# Patient Record
Sex: Male | Born: 1945 | Race: White | Hispanic: No | Smoking: Former smoker
Health system: Southern US, Community
[De-identification: ages and names within clinical notes are randomized; demographics above are authoritative.]

## PROBLEM LIST (undated history)

## (undated) DIAGNOSIS — I639 Cerebral infarction, unspecified: Secondary | ICD-10-CM

## (undated) DIAGNOSIS — N281 Cyst of kidney, acquired: Secondary | ICD-10-CM

## (undated) DIAGNOSIS — E049 Nontoxic goiter, unspecified: Secondary | ICD-10-CM

## (undated) DIAGNOSIS — N200 Calculus of kidney: Secondary | ICD-10-CM

## (undated) DIAGNOSIS — N400 Enlarged prostate without lower urinary tract symptoms: Secondary | ICD-10-CM

## (undated) DIAGNOSIS — N3289 Other specified disorders of bladder: Secondary | ICD-10-CM

## (undated) DIAGNOSIS — K5190 Ulcerative colitis, unspecified, without complications: Secondary | ICD-10-CM

## (undated) DIAGNOSIS — K632 Fistula of intestine: Secondary | ICD-10-CM

## (undated) DIAGNOSIS — K2270 Barrett's esophagus without dysplasia: Secondary | ICD-10-CM

## (undated) DIAGNOSIS — J45909 Unspecified asthma, uncomplicated: Secondary | ICD-10-CM

## (undated) HISTORY — PX: OTHER SURGICAL HISTORY: SHX169

## (undated) HISTORY — DX: Cyst of kidney, acquired: N28.1

## (undated) HISTORY — DX: Ulcerative colitis, unspecified, without complications: K51.90

## (undated) HISTORY — DX: Gilbert syndrome: E80.4

## (undated) HISTORY — DX: Barrett's esophagus without dysplasia: K22.70

## (undated) HISTORY — DX: Unspecified asthma, uncomplicated: J45.909

## (undated) HISTORY — DX: Cerebral infarction, unspecified: I63.9

## (undated) HISTORY — DX: Nontoxic goiter, unspecified: E04.9

## (undated) HISTORY — DX: Benign prostatic hyperplasia without lower urinary tract symptoms: N40.0

## (undated) HISTORY — DX: Calculus of kidney: N20.0

## (undated) HISTORY — DX: Other specified disorders of bladder: N32.89

## (undated) HISTORY — PX: CHOLECYSTECTOMY: SHX55

## (undated) HISTORY — DX: Fistula of intestine: K63.2

---

## 1990-08-16 HISTORY — PX: HERNIA REPAIR: SHX51

## 2004-08-16 DIAGNOSIS — I639 Cerebral infarction, unspecified: Secondary | ICD-10-CM

## 2004-08-16 HISTORY — DX: Cerebral infarction, unspecified: I63.9

## 2010-08-16 HISTORY — PX: PROSTATE BIOPSY: SHX241

## 2013-03-16 HISTORY — PX: HERNIA REPAIR: SHX51

## 2015-08-17 DIAGNOSIS — K5190 Ulcerative colitis, unspecified, without complications: Secondary | ICD-10-CM

## 2015-08-17 HISTORY — DX: Ulcerative colitis, unspecified, without complications: K51.90

## 2015-11-10 DIAGNOSIS — K639 Disease of intestine, unspecified: Secondary | ICD-10-CM | POA: Insufficient documentation

## 2017-04-04 DIAGNOSIS — K632 Fistula of intestine: Secondary | ICD-10-CM

## 2017-04-04 DIAGNOSIS — IMO0002 Reserved for concepts with insufficient information to code with codable children: Secondary | ICD-10-CM

## 2017-04-04 HISTORY — DX: Reserved for concepts with insufficient information to code with codable children: IMO0002

## 2017-04-04 HISTORY — DX: Fistula of intestine: K63.2

## 2017-08-30 DIAGNOSIS — T365X5A Adverse effect of aminoglycosides, initial encounter: Secondary | ICD-10-CM | POA: Insufficient documentation

## 2017-08-30 DIAGNOSIS — H818X3 Other disorders of vestibular function, bilateral: Secondary | ICD-10-CM | POA: Insufficient documentation

## 2017-12-22 ENCOUNTER — Encounter: Payer: Self-pay | Admitting: Internal Medicine

## 2018-01-02 ENCOUNTER — Encounter: Payer: Self-pay | Admitting: Internal Medicine

## 2018-01-30 ENCOUNTER — Encounter: Payer: Self-pay | Admitting: Internal Medicine

## 2018-02-13 ENCOUNTER — Ambulatory Visit (INDEPENDENT_AMBULATORY_CARE_PROVIDER_SITE_OTHER): Payer: Medicare Other | Admitting: Internal Medicine

## 2018-02-13 ENCOUNTER — Encounter: Payer: Self-pay | Admitting: Internal Medicine

## 2018-02-13 ENCOUNTER — Other Ambulatory Visit (INDEPENDENT_AMBULATORY_CARE_PROVIDER_SITE_OTHER): Payer: Medicare Other

## 2018-02-13 VITALS — BP 140/80 | HR 62 | Ht 71.0 in | Wt 161.2 lb

## 2018-02-13 DIAGNOSIS — J45991 Cough variant asthma: Secondary | ICD-10-CM

## 2018-02-13 DIAGNOSIS — R05 Cough: Secondary | ICD-10-CM | POA: Diagnosis not present

## 2018-02-13 DIAGNOSIS — R059 Cough, unspecified: Secondary | ICD-10-CM

## 2018-02-13 LAB — CBC WITH DIFFERENTIAL/PLATELET
Basophils Absolute: 0 10*3/uL (ref 0.0–0.1)
Basophils Relative: 0.8 % (ref 0.0–3.0)
EOS PCT: 7.1 % — AB (ref 0.0–5.0)
Eosinophils Absolute: 0.4 10*3/uL (ref 0.0–0.7)
HCT: 45.4 % (ref 39.0–52.0)
HEMOGLOBIN: 15.2 g/dL (ref 13.0–17.0)
LYMPHS PCT: 18.4 % (ref 12.0–46.0)
Lymphs Abs: 1 10*3/uL (ref 0.7–4.0)
MCHC: 33.6 g/dL (ref 30.0–36.0)
MCV: 88.3 fl (ref 78.0–100.0)
MONO ABS: 0.6 10*3/uL (ref 0.1–1.0)
MONOS PCT: 11 % (ref 3.0–12.0)
Neutro Abs: 3.5 10*3/uL (ref 1.4–7.7)
Neutrophils Relative %: 62.7 % (ref 43.0–77.0)
Platelets: 172 10*3/uL (ref 150.0–400.0)
RBC: 5.14 Mil/uL (ref 4.22–5.81)
RDW: 13.8 % (ref 11.5–15.5)
WBC: 5.6 10*3/uL (ref 4.0–10.5)

## 2018-02-13 LAB — NITRIC OXIDE: NITRIC OXIDE: 294

## 2018-02-13 MED ORDER — BUDESONIDE-FORMOTEROL FUMARATE 80-4.5 MCG/ACT IN AERO: 2.0000 | INHALATION_SPRAY | Freq: Two times a day (BID) | RESPIRATORY_TRACT | 11 refills | Status: DC

## 2018-02-13 MED ORDER — BUDESONIDE-FORMOTEROL FUMARATE 80-4.5 MCG/ACT IN AERO: 2.0000 | INHALATION_SPRAY | Freq: Two times a day (BID) | RESPIRATORY_TRACT | 0 refills | Status: DC

## 2018-02-13 NOTE — Assessment & Plan Note (Addendum)
FENO 02/13/2018  =   294 on no rx  - Spirometry 02/13/2018  FEV1 3.29 (99%)  Ratio 80 on no rx  - Allergy profile 02/13/2018 >  Eos 0. /  IgE pending   - 02/13/2018  After extensive coaching inhaler device  effectiveness =    90% > try symbicort 80 2bid  - Sinus CT to be done on return    DDX of  difficult airways management almost all start with A and  include Adherence, Ace Inhibitors, Acid Reflux, Active Sinus Disease, Alpha 1 Antitripsin deficiency, Anxiety masquerading as Airways dz,  ABPA,  Allergy(esp in young), Aspiration (esp in elderly), Adverse effects of meds,  Active smokers, A bunch of PE's (a small clot burden can't cause this syndrome unless there is already severe underlying pulm or vascular dz with poor reserve) plus two Bs  = Bronchiectasis and Beta blocker use..and one C= CHF     Adherence is always the initial "prime suspect" and is a multilayered concern that requires a "trust but verify" approach in every patient - starting with knowing how to use medications, especially inhalers, correctly, keeping up with refills and understanding the fundamental difference between maintenance and prns vs those medications only taken for a very short course and then stopped and not refilled.  - see hfa teaching  - return with all meds in hand using a trust but verify approach to confirm accurate Medication  Reconciliation The principal here is that until we are certain that the  patients are doing what we've asked, it makes no sense to ask them to do more.    Allergy ? Source > try low dose ics = symb 80 2bid / check allergy profile, low treshold to add singulair in the setting of such a high feno  ? Active sinus dz s/p remote sinus surgery > sinus CT on return   ? Alpha one AT def > very unlikely with nl pfts = this is not copd    Will return in 4 weeks with sinus ct and go from there.   Total time devoted to counseling  > 50 % of initial 60 min office visit:  review case with pt/ discussion  of options/alternatives/ personally creating written customized instructions  in presence of pt  then going over those specific  Instructions directly with the pt including how to use all of the meds but in particular covering each new medication in detail and the difference between the maintenance= "automatic" meds and the prns using an action plan format for the latter (If this problem/symptom => do that organization reading Left to right).  Please see AVS from this visit for a full list of these instructions which I personally wrote for this pt and  are unique to this visit.   See device teaching which extended face to face time for this visit

## 2018-02-13 NOTE — Progress Notes (Signed)
Subjective:    Patient ID: Juan House, male    DOB: 24-Nov-1945, 72 y.o.   MRN: [id number]  HPI   57 yowm quit smoking 2006 and grew up in house full of smokers with tendency  Ear  infections as child never required surgery but as an adult started having problems with nasal congestion/ recurrent sinus infections in his 14s and required sinus surgery around 2013 and some better but then fairly soon after sinus surgery noted  more freq lower respiratory symptoms > recurrent cough/ pna 2016 > O'neal eval with CT 09/26/14 showed only emphysema rec prn proair but found pt didn't really need it and did fine until Feb 2019 when started out with cold  Then sob /cough better with proair and rx with prednisone /abx back to nl then same symptoms in May 2019 then again [facility] June 2019 abx/ prednisone all better but referred 02/13/2018 by Dr Thea Silversmith for ? Why recurrent bronchitis.    02/13/2018 1st [facility] office visit/   s present complaints  Chief Complaint  Patient presents with  . Pulmonary Consult    referred here by Dr. Thea Silversmith, recurrent bronchitis  last proair 2 weeks prior to OV   Last pred x June 22   and last doxy was February 06 2018 and feels fine right now Has 2 story house, walks a mile and a half every day, some doe x hills sats never lower than 88%    No obvious patterns in day to day or daytime variability or assoc excess/ purulent sputum or mucus plugs or hemoptysis or cp or chest tightness, subjective wheeze or overt sinus or hb symptoms.   Sleeps fine flat on 1 pillow without nocturnal  or early am exacerbation  of respiratory  c/o's or need for noct saba. Also denies any obvious fluctuation of symptoms with weather or environmental changes or other aggravating or alleviating factors except as outlined above   No unusual exposure hx or h/o childhood pna/ asthma or knowledge of premature birth.  Current Allergies, Complete Past Medical History, Past Surgical  History, Family History, and Social History were reviewed in [employer] record.     Current Meds  Medication Sig  . folic acid (FOLVITE) 1 MG tablet TK 1 T PO BID  . mesalamine (LIALDA) 1.2 g EC tablet   . PROAIR HFA 108 (90 Base) MCG/ACT inhaler INL 2 PFS PO QID PRN  . tamsulosin (FLOMAX) 0.4 MG CAPS capsule TK 1 C PO D        Review of Systems  Constitutional: Negative for fever and unexpected weight change.  HENT: Positive for dental problem and sneezing. Negative for congestion, ear pain, nosebleeds, postnasal drip, rhinorrhea, sinus pressure, sore throat and trouble swallowing.   Eyes: Negative for redness and itching.  Respiratory: Positive for cough and shortness of breath. Negative for chest tightness and wheezing.   Cardiovascular: Negative for palpitations and leg swelling.  Gastrointestinal: Negative for nausea and vomiting.  Genitourinary: Negative for dysuria.  Musculoskeletal: Negative for joint swelling.  Skin: Negative for rash.  Neurological: Negative for headaches.  Hematological: Does not bruise/bleed easily.  Psychiatric/Behavioral: Negative for dysphoric mood. The patient is not nervous/anxious.        Objective:   Physical Exam  amb pleasant wm nad   Wt Readings from Last 3 Encounters:  02/13/18 161 lb 3.2 oz (73.1 kg)     Vital signs reviewed - Note on arrival 02 sats  100% on RA     HEENT: nl  turbinates bilaterally, and oropharynx. Nl external ear canals without cough reflex - mild turbinate edema non-specific pattern / partial lower dental plates   NECK :  without JVD/Nodes/TM/ nl carotid upstrokes bilaterally   LUNGS: no acc muscle use,  Nl contour chest which is clear to A and P bilaterally without cough on insp or exp maneuvers   CV:  RRR  no s3 or murmur or increase in P2, and no edema   ABD:  soft and nontender with nl inspiratory excursion in the supine position. No bruits or organomegaly appreciated, bowel  sounds nl  MS:  Nl gait/ ext warm without deformities, calf tenderness, cyanosis or clubbing No obvious joint restrictions   SKIN: warm and dry without lesions    NEURO:  alert, approp, nl sensorium with  no motor or cerebellar deficits apparent.      I personally reviewed images and agree with radiology impression as follows:  CXR:   12/22/17 and 01/30/18 no acute cardiopulmonary process    Labs ordered 02/13/2018   Allergy profile      Assessment & Plan:

## 2018-02-13 NOTE — Patient Instructions (Addendum)
Plan A = Automatic = symbicort 80 Take 2 puffs first thing in am and then another 2 puffs about 12 hours later.   Work on inhaler technique:  relax and gently blow all the way out then take a nice smooth deep breath back in, triggering the inhaler at same time you start breathing in.  Hold for up to 5 seconds if you can. Blow out thru nose. Rinse and gargle with water when done      Plan B = Backup Only use your albuterol as a rescue medication to be used if you can't catch your breath by resting or doing a relaxed purse lip breathing pattern.  - The less you use it, the better it will work when you need it. - Ok to use the inhaler up to 2 puffs  every 4 hours if you must but call for appointment if use goes up over your usual need - Don't leave home without it !!  (think of it like the spare tire for your car)     Please see patient coordinator before you leave today  to schedule sinus CT before next ov   Please remember to go to the lab department downstairs in the basement  for your tests - we will call you with the results when they are available.   Please schedule a follow up office visit in 4 weeks, sooner if needed

## 2018-02-14 LAB — INTERPRETATION:

## 2018-02-14 LAB — RESPIRATORY ALLERGY PROFILE REGION II ~~LOC~~
ALLERGEN, A. ALTERNATA, M6: 0.11 kU/L — AB
ASPERGILLUS FUMIGATUS M3: 0.17 kU/L — AB
Allergen, Cedar tree, t12: 0.16 kU/L — ABNORMAL HIGH
Allergen, Cottonwood, t14: 0.15 kU/L — ABNORMAL HIGH
Allergen, D pternoyssinus,d7: 2.54 kU/L — ABNORMAL HIGH
Allergen, Mouse Urine Protein, e78: <0.1 kU/L
Allergen, Mulberry, t76: <0.1 kU/L
Allergen, Oak,t7: 0.1 kU/L — ABNORMAL HIGH
Allergen, P. notatum, m1: 0.14 kU/L — ABNORMAL HIGH
BERMUDA GRASS: 0.12 kU/L — AB
BOX ELDER: 0.12 kU/L — AB
CLADOSPORIUM HERBARUM (M2) IGE: 0.3 kU/L — ABNORMAL HIGH
CLASS: 0
CLASS: 0
CLASS: 0
CLASS: 0
CLASS: 0
CLASS: 0
CLASS: 0
CLASS: 2
CLASS: 2
COMMON RAGWEED (SHORT) (W1) IGE: <0.1 kU/L
Cat Dander: <0.1 kU/L
Class: 0
Class: 0
Class: 0
Class: 0
Class: 0
Class: 0
Class: 0
Class: 0
Class: 0
Class: 0
Class: 0
Class: 0
Class: 0
Class: 0
Class: 2
Cockroach: 0.9 kU/L — ABNORMAL HIGH
D. FARINAE: 2.18 kU/L — AB
Dog Dander: 0.1 kU/L — ABNORMAL HIGH
Elm IgE: <0.1 kU/L
IGE (IMMUNOGLOBULIN E), SERUM: 1746 kU/L — AB (ref <=114)
JOHNSON GRASS: 0.12 kU/L — AB
ROUGH PIGWEED IGE: 0.12 kU/L — AB
SHEEP SORREL IGE: 0.14 kU/L — AB
Timothy Grass: 0.11 kU/L — ABNORMAL HIGH

## 2018-02-15 ENCOUNTER — Telehealth: Payer: Self-pay | Admitting: Internal Medicine

## 2018-02-15 NOTE — Telephone Encounter (Signed)
Notes recorded by Tanda Rockers, MD on 02/15/2018 at 5:56 AM EDT Call patient : Studies are c/w very severe allergies to grass, trees, dust > avoidance for now, Be sure patient has/keeps f/u ov so we can go over all the details of this study and get a plan together moving forward - ok to move up f/u if not feeling better and wants to be seen sooner   lmtcb x1 for pt

## 2018-02-15 NOTE — Telephone Encounter (Signed)
Advised pt of results. Pt understood and nothing further is needed.   

## 2018-02-15 NOTE — Telephone Encounter (Signed)
Patient returned phone call; pt contact # [phone]

## 2018-03-20 ENCOUNTER — Encounter: Payer: Self-pay | Admitting: Internal Medicine

## 2018-03-20 ENCOUNTER — Ambulatory Visit (INDEPENDENT_AMBULATORY_CARE_PROVIDER_SITE_OTHER): Payer: Medicare Other | Admitting: Internal Medicine

## 2018-03-20 ENCOUNTER — Ambulatory Visit (INDEPENDENT_AMBULATORY_CARE_PROVIDER_SITE_OTHER)
Admission: RE | Admit: 2018-03-20 | Discharge: 2018-03-20 | Disposition: A | Discharge status: Home / Self Care | Payer: Medicare Other | Source: Ambulatory Visit | Attending: Internal Medicine | Admitting: Internal Medicine

## 2018-03-20 VITALS — BP 124/60 | HR 67 | Ht 71.0 in | Wt 160.0 lb

## 2018-03-20 DIAGNOSIS — R05 Cough: Secondary | ICD-10-CM

## 2018-03-20 DIAGNOSIS — J45991 Cough variant asthma: Secondary | ICD-10-CM

## 2018-03-20 DIAGNOSIS — R059 Cough, unspecified: Secondary | ICD-10-CM

## 2018-03-20 LAB — NITRIC OXIDE: Nitric Oxide: 98

## 2018-03-20 MED ORDER — BUDESONIDE-FORMOTEROL FUMARATE 160-4.5 MCG/ACT IN AERO: 2.0000 | INHALATION_SPRAY | Freq: Two times a day (BID) | RESPIRATORY_TRACT | 0 refills | Status: DC

## 2018-03-20 NOTE — Progress Notes (Signed)
Subjective:    Patient ID: Juan House, male    DOB: 09-Jun-1946, 72 y.o.   MRN: [id number]    Brief patient profile:  26 yowm quit smoking 2006 and grew up in house full of smokers with tendency  Ear  infections as child never required surgery but as an adult started having problems with nasal congestion/ recurrent sinus infections in his 71s and required sinus surgery around 2013 and some better but then fairly soon after sinus surgery noted  more freq lower respiratory symptoms > recurrent cough/ pna 2016 > O'neal eval with CT 09/26/14 showed only emphysema rec prn proair but found pt didn't really need it and did fine until Feb 2019 when started out with cold  Then sob /cough better with proair and rx with prednisone /abx back to nl then same symptoms in May 2019 then again [facility] June 2019 abx/ prednisone all better but referred 02/13/2018 by Dr Thea Silversmith for ? Why recurrent bronchitis.   History of Present Illness  02/13/2018 1st [facility] office visit/   s present complaints  Chief Complaint  Patient presents with  . Pulmonary Consult    referred here by Dr. Thea Silversmith, recurrent bronchitis  last proair 2 weeks prior to OV   Last pred x June 22   and last doxy was February 06 2018 and feels fine right now Has 2 story house, walks a mile and a half every day, some doe x hills sats never lower than 88% rec Plan A = Automatic = symbicort 80 Take 2 puffs first thing in am and then another 2 puffs about 12 hours later.  Work on inhaler technique:  Plan B = Backup Only use your albuterol as a rescue medication   03/20/2018  f/u ov/ re: asthma cough variant  Chief Complaint  Patient presents with  . Follow-up    Breathing has improved some. He notices he is only SOB when he first wakes. He has not had to use his proair.   Dyspnea:  Not limited by breathing from desired activities - feels fine p am symbicort but delays taking the first 2 puffs instead of using first  thing in am   Cough: none  SABA use: not since started on symb    No obvious day to day or daytime variability or assoc excess/ purulent sputum or mucus plugs or hemoptysis or cp or chest tightness, subjective wheeze or overt sinus or hb symptoms.   Sleeping: flat one pillow without nocturnal   exacerbation  of respiratory  c/o's or need for noct saba. Also denies any obvious fluctuation of symptoms with weather or environmental changes or other aggravating or alleviating factors except as outlined above   No unusual exposure hx or h/o childhood pna/ asthma or knowledge of premature birth.  Current Allergies, Complete Past Medical History, Past Surgical History, Family History, and Social History were reviewed in [employer] record.  ROS  The following are not active complaints unless bolded Hoarseness, sore throat, dysphagia, dental problems, itching, sneezing,  nasal congestion or discharge of excess mucus or purulent secretions, ear ache,   fever, chills, sweats, unintended wt loss or wt gain, classically pleuritic or exertional cp,  orthopnea pnd or arm/hand swelling  or leg swelling, presyncope, palpitations, abdominal pain, anorexia, nausea, vomiting, diarrhea  or change in bowel habits or change in bladder habits, change in stools or change in urine, dysuria, hematuria,  rash, arthralgias, visual complaints, headache, numbness, weakness  or ataxia or problems with walking or coordination,  change in mood or  memory.        Current Meds  Medication Sig  . folic acid (FOLVITE) 1 MG tablet TK 1 T PO BID  . mesalamine (LIALDA) 1.2 g EC tablet   . omeprazole (PRILOSEC) 40 MG capsule Take 1 capsule by mouth daily before breakfast.  . PROAIR HFA 108 (90 Base) MCG/ACT inhaler INL 2 PFS PO QID PRN  . tamsulosin (FLOMAX) 0.4 MG CAPS capsule TK 1 C PO D  .   budesonide-formoterol (SYMBICORT) 80-4.5 MCG/ACT inhaler Inhale 2 puffs into the lungs 2 (two) times daily.          .          Objective:   Physical Exam  amb pleasant wm nad   Wt Readings from Last 3 Encounters:  02/13/18 161 lb 3.2 oz (73.1 kg)     Vital signs reviewed - Note on arrival 02 sats  100% on RA     HEENT: nl dentition, turbinates bilaterally, and oropharynx. Nl external ear canals without cough reflex   NECK :  without JVD/Nodes/TM/ nl carotid upstrokes bilaterally   LUNGS: no acc muscle use,  Nl contour chest which is clear to A and P bilaterally without cough on insp or exp maneuvers   CV:  RRR  no s3 or murmur or increase in P2, and no edema   ABD:  soft and nontender with nl inspiratory excursion in the supine position. No bruits or organomegaly appreciated, bowel sounds nl  MS:  Nl gait/ ext warm without deformities, calf tenderness, cyanosis or clubbing No obvious joint restrictions   SKIN: warm and dry without lesions    NEURO:  alert, approp, nl sensorium with  no motor or cerebellar deficits apparent.                 Assessment & Plan:

## 2018-03-20 NOTE — Patient Instructions (Addendum)
Try symbicort 160 Take 2 puffs first thing in am and then another 2 puffs about 12 hours later.   singulair 10 mg each pm can added for either worse upper or lower respiratory symptoms    If more hoarse on 160 go back to the 80 strength    Please schedule a follow up visit in 3 months but call sooner if needed

## 2018-03-22 ENCOUNTER — Encounter: Payer: Self-pay | Admitting: Internal Medicine

## 2018-03-22 NOTE — Assessment & Plan Note (Signed)
FENO 02/13/2018  =   294 on no rx  - Spirometry 02/13/2018  FEV1 3.29 (99%)  Ratio 80 on no rx  - Allergy profile 02/13/2018 >  Eos 0.4 /  IgE  1745  Grass, trees, dust  - 02/13/2018  After extensive coaching inhaler device  effectiveness =    90% > try symbicort 80 2bid  - Sinus CT 03/20/2018 >>>  1. Diffuse opacification of anterior and posterior ethmoid air cells and bilateral frontal sinuses. 2. Moderate mucosal thickening involving the dependent maxillary and ethmoid air cells bilaterally - FENO 03/20/2018  =   98   - The proper method of use, as well as anticipated side effects, of a metered-dose inhaler are discussed and demonstrated to the patient.     Despite severe allergies as above and persistent elevation of IgE All goals of chronic asthma control met including optimal function and elimination of symptoms with minimal need for rescue therapy.  Contingencies discussed in full including contacting this office immediately if not controlling the symptoms using the rule of two's.      Will increase symb to 160 2bid based on feno and then add singulair if not 100% and if still not adequately controlled will be candidate for biologics for sure   I had an extended discussion with the patient reviewing all relevant studies completed to date and  lasting 15 to 20 minutes of a 25 minute visit    See device teaching which extended face to face time for this visit.  Each maintenance medication was reviewed in detail including emphasizing most importantly the difference between maintenance and prns and under what circumstances the prns are to be triggered using an action plan format that is not reflected in the computer generated alphabetically organized AVS which I have not found useful in most complex patients, especially with respiratory illnesses  Please see AVS for specific instructions unique to this visit that I personally wrote and verbalized to the the pt in detail and then reviewed with pt  by  my nurse highlighting any  changes in therapy recommended at today's visit to their plan of care.

## 2018-04-18 ENCOUNTER — Other Ambulatory Visit: Payer: Self-pay | Admitting: Internal Medicine

## 2018-04-18 MED ORDER — MONTELUKAST SODIUM 10 MG PO TABS: 10.0000 mg | ORAL_TABLET | Freq: Every day | ORAL | 5 refills | Status: DC

## 2018-04-18 NOTE — Telephone Encounter (Signed)
Replied to patient's email offering the order for Singulair.

## 2018-04-18 NOTE — Telephone Encounter (Signed)
Worth trying singulair 10 mg daily and f/u with ent prn

## 2018-04-18 NOTE — Telephone Encounter (Signed)
Dr. Melvyn Novas please advise on patients e-mail below. Thank you.

## 2018-06-20 ENCOUNTER — Encounter: Payer: Self-pay | Admitting: Internal Medicine

## 2018-06-20 ENCOUNTER — Ambulatory Visit (INDEPENDENT_AMBULATORY_CARE_PROVIDER_SITE_OTHER): Payer: Medicare Other | Admitting: Internal Medicine

## 2018-06-20 VITALS — BP 126/74 | HR 60 | Ht 71.0 in | Wt 160.0 lb

## 2018-06-20 DIAGNOSIS — J45991 Cough variant asthma: Secondary | ICD-10-CM | POA: Diagnosis not present

## 2018-06-20 MED ORDER — BUDESONIDE-FORMOTEROL FUMARATE 80-4.5 MCG/ACT IN AERO: INHALATION_SPRAY | RESPIRATORY_TRACT | Status: DC

## 2018-06-20 NOTE — Patient Instructions (Addendum)
No change in medications    Please schedule a follow up visit in 6  months but call sooner if needed  

## 2018-06-20 NOTE — Progress Notes (Addendum)
Subjective:    Patient ID: Juan House, male    DOB: 22-Jan-1946, 72 y.o.   MRN: [id number]    Brief patient profile:  72 yowm quit smoking 2006 and grew up in house full of smokers with tendency  Ear  infections as child never required surgery but as an adult started having problems with nasal congestion/ recurrent sinus infections in his 43s and required sinus surgery around 2013 and some better but then fairly soon after sinus surgery noted  more freq lower respiratory symptoms > recurrent cough/ pna 2016 > O'neal eval with CT 09/26/14 showed only emphysema rec prn proair but found pt didn't really need it and did fine until Feb 2019 when started out with cold  Then sob /cough better with proair and rx with prednisone /abx back to nl then same symptoms in May 2019 then again [facility] June 2019 abx/ prednisone all better but referred 02/13/2018 by Dr Thea Silversmith for ? Why recurrent bronchitis.   History of Present Illness  02/13/2018 1st [facility] office visit/   s present complaints  Chief Complaint  Patient presents with  . Pulmonary Consult    referred here by Dr. Thea Silversmith, recurrent bronchitis  last proair 2 weeks prior to OV   Last pred x June 22   and last doxy was February 06 2018 and feels fine right now Has 2 story house, walks a mile and a half every day, some doe x hills sats never lower than 88% rec Plan A = Automatic = symbicort 80 Take 2 puffs first thing in am and then another 2 puffs about 12 hours later.  Work on inhaler technique:  Plan B = Backup Only use your albuterol as a rescue medication   03/20/2018  f/u ov/ re: asthma cough variant  Chief Complaint  Patient presents with  . Follow-up    Breathing has improved some. He notices he is only SOB when he first wakes. He has not had to use his proair.   Dyspnea:  Not limited by breathing from desired activities - feels fine p am symbicort but delays taking the first 2 puffs instead of using first  thing in am   Cough: none SABA use: not since started on symb  rec Try symbicort 160 Take 2 puffs first thing in am and then another 2 puffs about 12 hours later.  singulair 10 mg each pm can added for either worse upper or lower respiratory symptoms  If more hoarse on 160 go back to the 80 strength      06/20/2018  f/u ov/ re: on symb 80 2bid and singulair maint / worse hoarseness with sym 160 Chief Complaint  Patient presents with  . Follow-up    Breathing has improved to his normal baseline. He has not needed his albuterol inhaler.   Not limited by breathing from desired activities   No cough/ sleeping fine / no need for saba   No obvious day to day or daytime variability or assoc excess/ purulent sputum or mucus plugs or hemoptysis or cp or chest tightness, subjective wheeze or overt sinus or hb symptoms.   sleepoing without nocturnal  or early am exacerbation  of respiratory  c/o's or need for noct saba. Also denies any obvious fluctuation of symptoms with weather or environmental changes or other aggravating or alleviating factors except as outlined above   No unusual exposure hx or h/o childhood pna/ asthma or knowledge of premature birth.  Current  Allergies, Complete Past Medical History, Past Surgical History, Family History, and Social History were reviewed in [employer] record.  ROS  The following are not active complaints unless bolded Hoarseness, sore throat, dysphagia, dental problems, itching, sneezing,  nasal congestion or discharge of excess mucus or purulent secretions, ear ache,   fever, chills, sweats, unintended wt loss or wt gain, classically pleuritic or exertional cp,  orthopnea pnd or arm/hand swelling  or leg swelling, presyncope, palpitations, abdominal pain, anorexia, nausea, vomiting, diarrhea  or change in bowel habits or change in bladder habits, change in stools or change in urine, dysuria, hematuria,  rash, arthralgias, visual  complaints, headache, numbness, weakness or ataxia or problems with walking or coordination,  change in mood or  Memory. Other:  Dizziness chronically         Current Meds  Medication Sig  . folic acid (FOLVITE) 1 MG tablet TK 1 T PO BID  . mesalamine (LIALDA) 1.2 g EC tablet   . montelukast (SINGULAIR) 10 MG tablet TAKE 1 TABLET(10 MG) BY MOUTH AT BEDTIME  . PROAIR HFA 108 (90 Base) MCG/ACT inhaler INL 2 PFS PO QID PRN  . tamsulosin (FLOMAX) 0.4 MG CAPS capsule TK 1 C PO D  .   budesonide-formoterol (SYMBICORT) 80-4.5 MCG/ACT inhaler Inhale 2 puffs into the lungs 2 (two) times daily.          .          Objective:   Physical Exam  amb wm nad    Wt Readings from Last 3 Encounters:  06/20/18 160 lb (72.6 kg)  03/20/18 160 lb (72.6 kg)  02/13/18 161 lb 3.2 oz (73.1 kg)     Vital signs reviewed - Note on arrival 02 sats  99% on RA      HEENT: nl dentition,  , and oropharynx. Nl external ear canals with excessive wax on L / mod severe  bilateral non-specific turbinate edema    NECK :  without JVD/Nodes/TM/ nl carotid upstrokes bilaterally   LUNGS: no acc muscle use,  Nl contour chest which is clear to A and P bilaterally without cough on insp or exp maneuvers   CV:  RRR  no s3 or murmur or increase in P2, and no edema   ABD:  soft and nontender with nl inspiratory excursion in the supine position. No bruits or organomegaly appreciated, bowel sounds nl  MS:  Nl gait/ ext warm without deformities, calf tenderness, cyanosis or clubbing No obvious joint restrictions   SKIN: warm and dry without lesions    NEURO:  alert, approp, nl sensorium with  no motor or cerebellar deficits apparent.                Assessment & Plan:

## 2018-06-21 ENCOUNTER — Encounter: Payer: Self-pay | Admitting: Internal Medicine

## 2018-06-21 NOTE — Assessment & Plan Note (Addendum)
FENO 02/13/2018  =   294 on no rx  - Spirometry 02/13/2018  FEV1 3.29 (99%)  Ratio 80 on no rx  - Allergy profile 02/13/2018 >  Eos 0.4 /  IgE  1745  Grass, trees, dust  - 02/13/2018  After extensive coaching inhaler device  effectiveness =    90% > try symbicort 80 2bid  - Sinus CT 03/20/2018 >>>  1. Diffuse opacification of anterior and posterior ethmoid air cells and bilateral frontal sinuses. 2. Moderate mucosal thickening involving the dependent maxillary and ethmoid air cells bilaterally - FENO 03/20/2018  =   98  - 06/20/2018  After extensive coaching inhaler device,  effectiveness =  75%  On just the symb 80 2bid and singulair >>> All goals of chronic asthma control met including optimal function and elimination of symptoms with minimal need for rescue therapy.  Contingencies discussed in full including contacting this office immediately if not controlling the symptoms using the rule of two's.     I had an extended discussion with the patient reviewing all relevant studies completed to date and  lasting 10 minutes of a 15 minute visit    See device teaching which extended face to face time for this visit.  Each maintenance medication was reviewed in detail including emphasizing most importantly the difference between maintenance and prns and under what circumstances the prns are to be triggered using an action plan format that is not reflected in the computer generated alphabetically organized AVS which I have not found useful in most complex patients, especially with respiratory illnesses  Please see AVS for specific instructions unique to this visit that I personally wrote and verbalized to the the pt in detail and then reviewed with pt  by my nurse highlighting any  changes in therapy recommended at today's visit to their plan of care.

## 2018-06-29 ENCOUNTER — Telehealth: Payer: Self-pay | Admitting: Internal Medicine

## 2018-06-29 DIAGNOSIS — J328 Other chronic sinusitis: Secondary | ICD-10-CM

## 2018-06-29 NOTE — Telephone Encounter (Signed)
Called and spoke with patient, he stated that he would like to be referred to an ENT for his sinus issues and dizziness. Patient would like for this to be taken care of soon. Patient was sent for an MRI by his PCP and they stated that he had serious sinus issues.    MW please advise, thank you.

## 2018-06-29 NOTE — Telephone Encounter (Signed)
Patient returned call [phone].

## 2018-06-29 NOTE — Telephone Encounter (Signed)
LMTCB

## 2018-06-30 NOTE — Telephone Encounter (Signed)
Attempted to contact pt. I did not receive an answer. I have left a message for pt to return our call.  

## 2018-06-30 NOTE — Telephone Encounter (Signed)
There is a CT of sinuses in epic that I did that shows some chronic changes but nothing acute - I don't see an mri anywhere so suspect he's talking about at CT.  I'm fine with referring him to [facility] group with dx chronic sinusitis but not sure the dizziness is related - they do see that all the time so he can ask if they think that the sinus ct finding are related or whether it's an inner ear problem since they are sinus and ear doctors.

## 2018-06-30 NOTE — Telephone Encounter (Signed)
Called pt and advised message from the provider. Pt understood and verbalized understanding. Nothing further is needed.   Referral for ENT sent.

## 2018-06-30 NOTE — Telephone Encounter (Signed)
Mr. Melchior has been attempting to return call to Mendel Ryder and is unable to get thru. I told him I would send a message to call him back at [phone]. Donell Sievert [facility]

## 2018-06-30 NOTE — Telephone Encounter (Signed)
Patient is returning Juan House's call, CB is [phone]

## 2018-07-17 DIAGNOSIS — H8193 Unspecified disorder of vestibular function, bilateral: Secondary | ICD-10-CM | POA: Insufficient documentation

## 2018-07-17 DIAGNOSIS — J324 Chronic pansinusitis: Secondary | ICD-10-CM | POA: Insufficient documentation

## 2018-09-11 MED ORDER — MONTELUKAST SODIUM 10 MG PO TABS: ORAL_TABLET | ORAL | 3 refills | Status: DC

## 2018-12-19 ENCOUNTER — Ambulatory Visit: Payer: Medicare Other | Admitting: Internal Medicine

## 2019-02-20 ENCOUNTER — Other Ambulatory Visit: Payer: Self-pay

## 2019-02-20 ENCOUNTER — Ambulatory Visit (INDEPENDENT_AMBULATORY_CARE_PROVIDER_SITE_OTHER): Payer: Medicare Other | Admitting: Internal Medicine

## 2019-02-20 ENCOUNTER — Encounter: Payer: Self-pay | Admitting: Internal Medicine

## 2019-02-20 DIAGNOSIS — J45991 Cough variant asthma: Secondary | ICD-10-CM | POA: Diagnosis not present

## 2019-02-20 MED ORDER — PROAIR HFA 108 (90 BASE) MCG/ACT IN AERS: 1.0000 | INHALATION_SPRAY | RESPIRATORY_TRACT | 1 refills | Status: DC | PRN

## 2019-02-20 MED ORDER — BUDESONIDE-FORMOTEROL FUMARATE 160-4.5 MCG/ACT IN AERO: INHALATION_SPRAY | RESPIRATORY_TRACT | 12 refills | Status: DC

## 2019-02-20 NOTE — Patient Instructions (Signed)
Increase the symbicort to  160 Take 2 puffs first thing in am and then another 2 puffs about 12 hours later.     Work on inhaler technique:  relax and gently blow all the way out then take a nice smooth deep breath back in, triggering the inhaler at same time you start breathing in.  Hold for up to 5 seconds if you can. Blow out thru nose. Rinse and gargle with water when done      Only use your albuterol as a rescue medication to be used if you can't catch your breath by resting or doing a relaxed purse lip breathing pattern.  - The less you use it, the better it will work when you need it. - Ok to use up to 2 puffs  every 4 hours if you must but call for immediate appointment if use goes up over your usual need - Don't leave home without it !!  (think of it like the spare tire for your car)   Please schedule a follow up visit in 12 months but call sooner if needed

## 2019-02-20 NOTE — Progress Notes (Signed)
Subjective:    Patient ID: Juan House, male    DOB: 1946-05-18, 73 y.o.   MRN: [id number]    Brief patient profile:  72 yowm quit smoking 2006 and grew up in house full of smokers with tendency  Ear  infections as child never required surgery but as an adult started having problems with nasal congestion/ recurrent sinus infections in his 19s and required sinus surgery around 2013 and some better but then fairly soon after sinus surgery noted  more freq lower respiratory symptoms > recurrent cough/ pna 2016 > O'neal eval with CT 09/26/14 showed only emphysema rec prn proair but found pt didn't really need it and did fine until Feb 2019 when started out with cold  Then sob /cough better with proair and rx with prednisone /abx back to nl then same symptoms in May 2019 then again [facility] June 2019 abx/ prednisone all better but referred 02/13/2018 by Dr Thea Silversmith for ? Why recurrent bronchitis.   History of Present Illness  02/13/2018 1st [facility] office visit/   s present complaints  Chief Complaint  Patient presents with  . Pulmonary Consult    referred here by Dr. Thea Silversmith, recurrent bronchitis  last proair 2 weeks prior to OV   Last pred x June 22   and last doxy was February 06 2018 and feels fine right now Has 2 story house, walks a mile and a half every day, some doe x hills sats never lower than 88% rec Plan A = Automatic = symbicort 80 Take 2 puffs first thing in am and then another 2 puffs about 12 hours later.  Work on inhaler technique:  Plan B = Backup Only use your albuterol as a rescue medication   03/20/2018  f/u ov/ re: asthma cough variant  Chief Complaint  Patient presents with  . Follow-up    Breathing has improved some. He notices he is only SOB when he first wakes. He has not had to use his proair.   Dyspnea:  Not limited by breathing from desired activities - feels fine p am symbicort but delays taking the first 2 puffs instead of using first  thing in am   Cough: none SABA use: not since started on symb  rec Try symbicort 160 Take 2 puffs first thing in am and then another 2 puffs about 12 hours later.  singulair 10 mg each pm can added for either worse upper or lower respiratory symptoms  If more hoarse on 160 go back to the 80 strength      06/20/2018  f/u ov/ re: on symb 80 2bid and singulair maint / worse hoarseness with sym 160 Chief Complaint  Patient presents with  . Follow-up    Breathing has improved to his normal baseline. He has not needed his albuterol inhaler.   Not limited by breathing from desired activities   No cough/ sleeping fine / no need for saba  rec No change   02/20/2019  f/u ov/ re: cough variant asthma/ symb 80 2bid / singulair  Chief Complaint  Patient presents with  . Follow-up    Breathing has been doing well overall. He has not used his rescue inhaler recently.   Dyspnea:  Walking 2 miles a day in one hour, some hills doe fine but thinks he was better on symb 160 vs 80 and wished to go back on the ghier strength  Cough: none Sleeping: no resp symptoms/ lies flat  SABA use:  rarely  02: none    No obvious day to day or daytime variability or assoc excess/ purulent sputum or mucus plugs or hemoptysis or cp or chest tightness, subjective wheeze or overt sinus or hb symptoms.   Sleeping  without nocturnal  or early am exacerbation  of respiratory  c/o's or need for noct saba. Also denies any obvious fluctuation of symptoms with weather or environmental changes or other aggravating or alleviating factors except as outlined above   No unusual exposure hx or h/o childhood pna/ asthma or knowledge of premature birth.  Current Allergies, Complete Past Medical History, Past Surgical History, Family History, and Social History were reviewed in [employer] record.  ROS  The following are not active complaints unless bolded Hoarseness, sore throat, dysphagia, dental  problems, itching, sneezing,  nasal congestion or discharge of excess mucus or purulent secretions, ear ache,   fever, chills, sweats, unintended wt loss or wt gain, classically pleuritic or exertional cp,  orthopnea pnd or arm/hand swelling  or leg swelling, presyncope, palpitations, abdominal pain, anorexia, nausea, vomiting, diarrhea  or change in bowel habits or change in bladder habits, change in stools or change in urine, dysuria, hematuria,  rash, arthralgias, visual complaints, headache, numbness, weakness or ataxia or problems with walking or coordination,  change in mood or  memory.        Current Meds  Medication Sig  . budesonide-formoterol (SYMBICORT) 80-4.5 MCG/ACT inhaler Take 2 puffs first thing in am and then another 2 puffs about 12 hours later.  . folic acid (FOLVITE) 1 MG tablet TK 1 T PO BID  . mesalamine (LIALDA) 1.2 g EC tablet   . montelukast (SINGULAIR) 10 MG tablet TAKE 1 TABLET(10 MG) BY MOUTH AT BEDTIME  . mupirocin nasal ointment (BACTROBAN) 2 % Nasal irrigation daily  . PROAIR HFA 108 (90 Base) MCG/ACT inhaler INL 2 PFS PO QID PRN  . Probiotic CAPS Take 1 capsule by mouth daily.  . tamsulosin (FLOMAX) 0.4 MG CAPS capsule TK 1 C PO D              Objective:   Physical Exam  amb wm nad     02/20/2019        154   06/20/18 160 lb (72.6 kg)  03/20/18 160 lb (72.6 kg)  02/13/18 161 lb 3.2 oz (73.1 kg)    Vital signs reviewed - Note on arrival 02 sats  98% on RA     . HEENT: bottom partial dentue/ nl  oropharynx. Nl external ear canals without cough reflex -  Mild bilateral non-specific turbinate edema     NECK :  without JVD/Nodes/TM/ nl carotid upstrokes bilaterally   LUNGS: no acc muscle use,  Min barrel  contour chest wall with bilateral  slightly decreased bs s audible wheeze and  without cough on insp or exp maneuver and min  Hyperresonant  to  percussion bilaterally     CV:  RRR  no s3 or murmur or increase in P2, and no edema   ABD:  soft and  nontender with pos end  insp Hoover's  in the supine position. No bruits or organomegaly appreciated, bowel sounds nl  MS:   Nl gait/  ext warm without deformities, calf tenderness, cyanosis or clubbing No obvious joint restrictions   SKIN: warm and dry without lesions    NEURO:  alert, approp, nl sensorium with  no motor or cerebellar deficits apparent.  Assessment & Plan:

## 2019-02-20 NOTE — Assessment & Plan Note (Signed)
FENO 02/13/2018  =   294 on no rx  - Spirometry 02/13/2018  FEV1 3.29 (99%)  Ratio 80 on no rx  - Allergy profile 02/13/2018 >  Eos 0.4 /  IgE  1745  Grass, trees, dust  - 02/13/2018  After extensive coaching inhaler device  effectiveness =    90% > try symbicort 80 2bid  - Sinus CT 03/20/2018 >>>  1. Diffuse opacification of anterior and posterior ethmoid air cells and bilateral frontal sinuses. 2. Moderate mucosal thickening involving the dependent maxillary and ethmoid air cells bilaterally - FENO 03/20/2018  =   98 on 80 bid > increased to 160 2bid but changed on his own back to 80  - 02/20/2019  After extensive coaching inhaler device,  effectiveness =    75% (short ti) > increased back to symb 160 2bid at pt request   Could probably use either dose of symbicort if used optimal mdi technique but in meantime he's convinced the 160 worked better overall (it risks hoarseness if not used properly, however)   If not happy with the upper airways effects of 160 ok to go back to 80 2bid  Reviewed rule of 2s: if your breathing worsens or you need to use your rescue inhaler more than twice weekly or wake up more than twice a month with any respiratory symptoms or require more than two rescue inhalers per year, we need to see you right away because this means we're not controlling the underlying problem (inflammation) adequately.  Rescue inhalers (albuterol) do not control inflammation and overuse can lead to unnecessary and costly consequences.  They can make you feel better temporarily but eventually they will quit working effectively much as sleep aids lead to more insomnia if used regularly.     .I had an extended discussion with the patient reviewing all relevant studies completed to date and  lasting 15 to 20 minutes of a 25 minute visit    See device teaching which extended face to face time for this visit.  Each maintenance medication was reviewed in detail including emphasizing most importantly the  difference between maintenance and prns and under what circumstances the prns are to be triggered using an action plan format that is not reflected in the computer generated alphabetically organized AVS which I have not found useful in most complex patients, especially with respiratory illnesses  Please see AVS for specific instructions unique to this visit that I personally wrote and verbalized to the the pt in detail and then reviewed with pt  by my nurse highlighting any  changes in therapy recommended at today's visit to their plan of care.

## 2019-06-28 IMAGING — CT CT PARANASAL SINUSES LIMITED
1 of 2 series · 10 of 13 positions shown, 13 images · non-contrast
Comparison: None.

CLINICAL DATA: Cough variant asthma. Cough, persistent. Nasal
congestion. Sinus surgery 6 years ago.

EXAM:
CT PARANASAL SINUS LIMITED WITHOUT CONTRAST
TECHNIQUE: Non-contiguous multidetector CT images of the paranasal sinuses were
obtained in a single plane without contrast.

[Series 4: limited sinus st · axial · 0.39mm/px · z∈[-48,+42]mm · 10 of 12 slices shown, 13 images]
[im 2/12  brain]
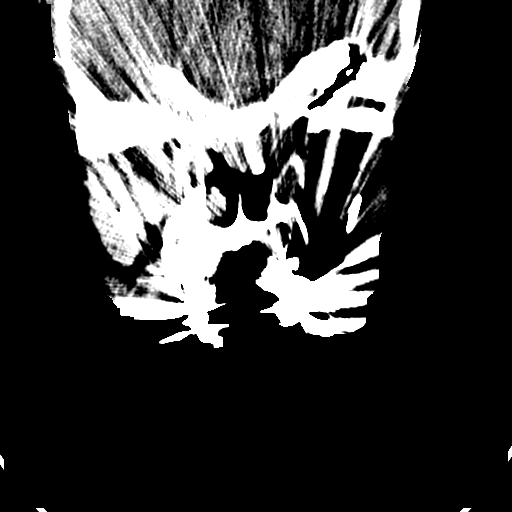
[im 2/12  bone]
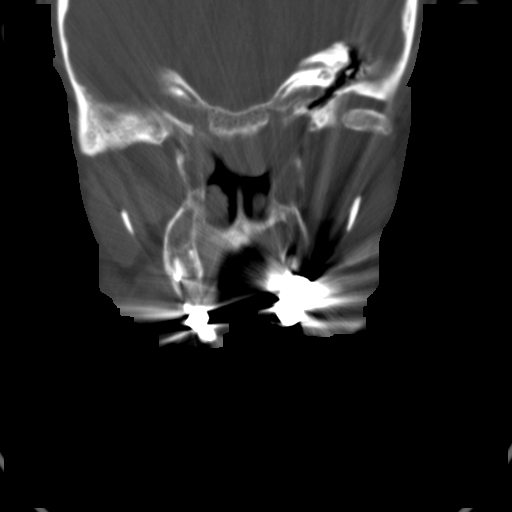
[im 3/12  bone]
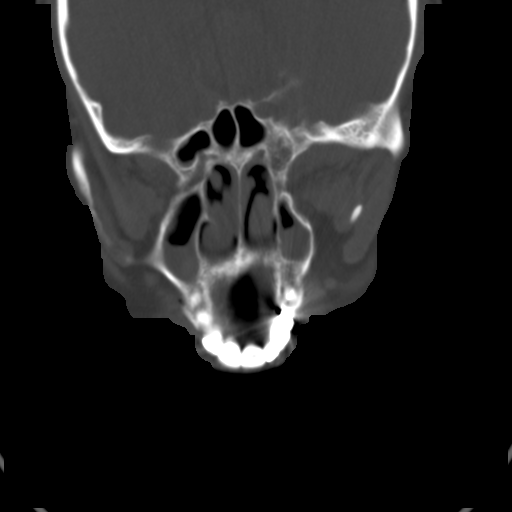
[im 4/12  bone]
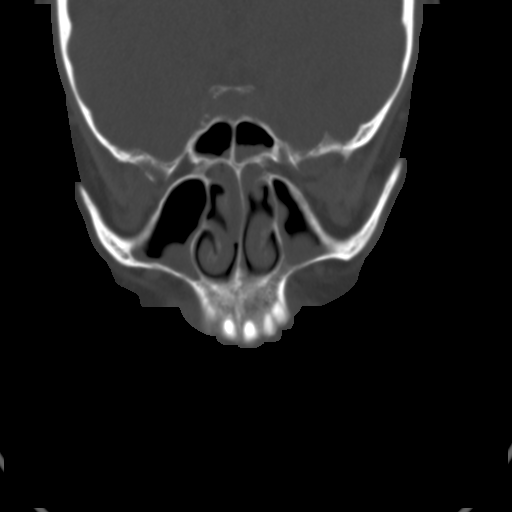
[im 5/12  bone]
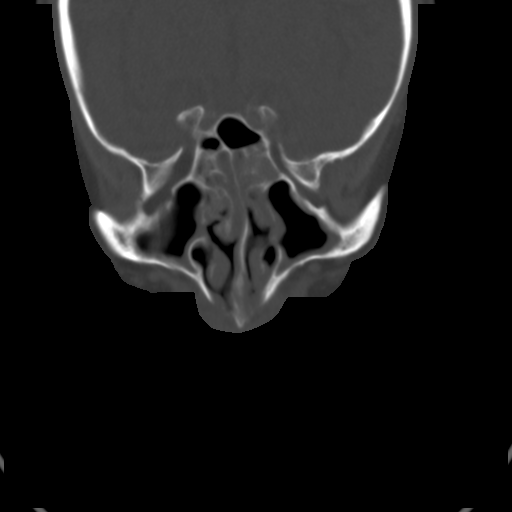
[im 6/12  brain]
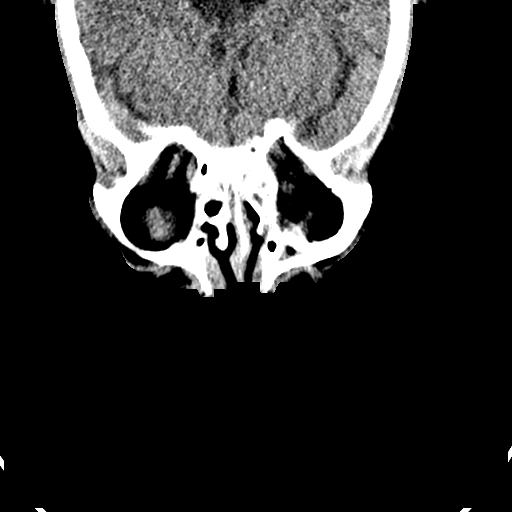
[im 6/12  bone]
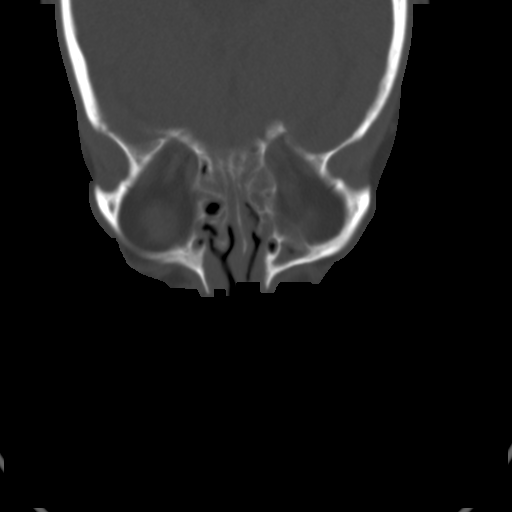
[im 7/12  bone]
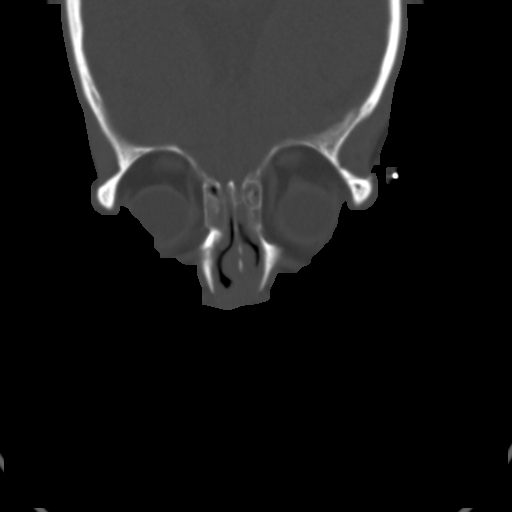
[im 8/12  bone]
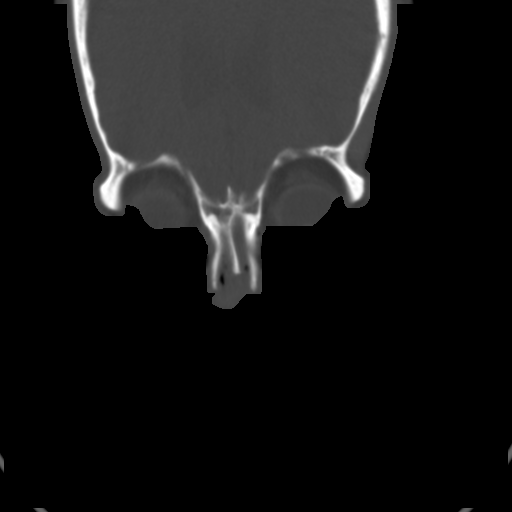
[im 9/12  bone]
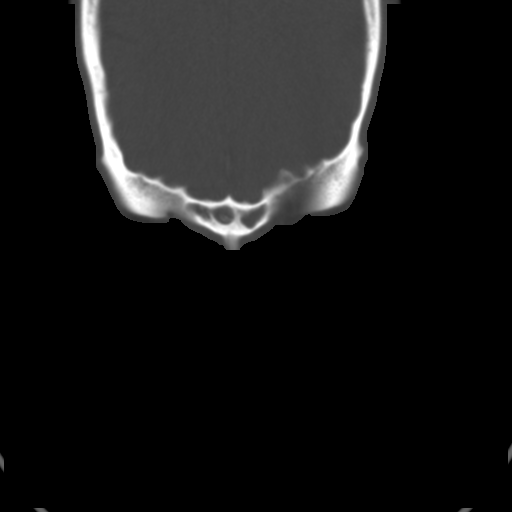
[im 10/12  brain]
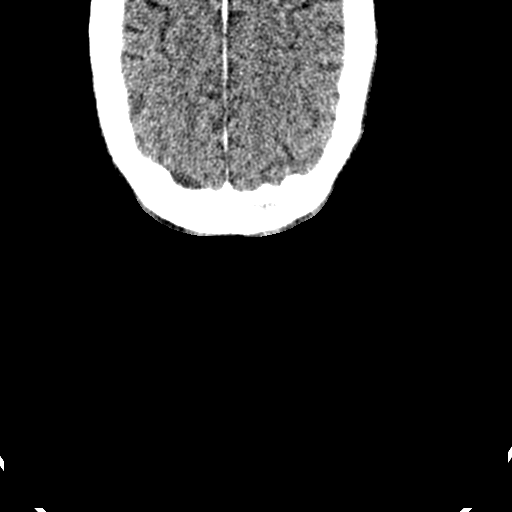
[im 10/12  bone]
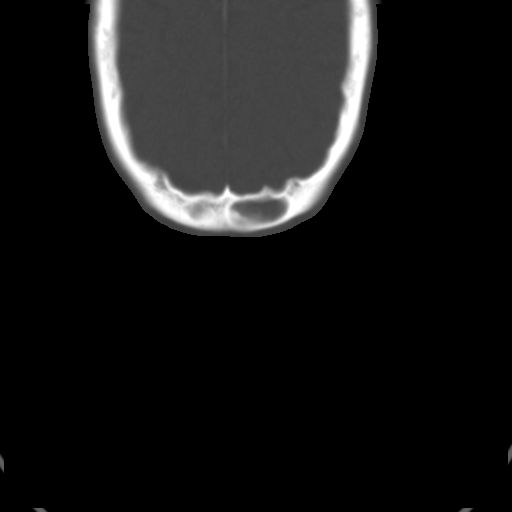
[im 11/12  bone]
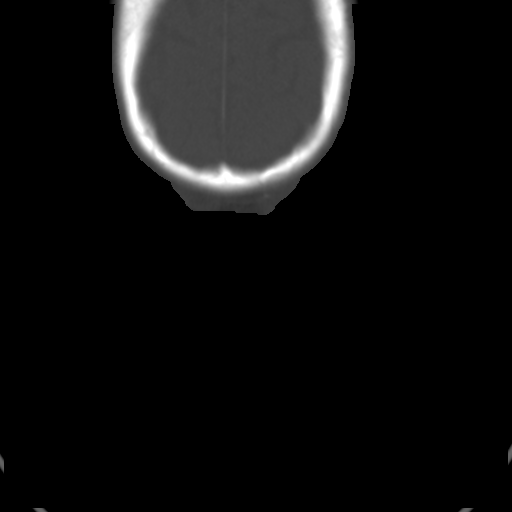

[10 of 13 positions shown; findings below may reference images not displayed]

FINDINGS: The frontal and anterior ethmoid air cells are opacified. There is
circumferential mucosal thickening involving the maxillary sinuses
bilaterally. Inferior mucosal thickening is present in the sphenoid
sinuses. Posterior ethmoid air cells are opacified. Prominent soft
tissue within the nasal cavity raises the possibility of nasal
polyposis.

Limited imaging of the brain is unremarkable.
IMPRESSION: 1. Diffuse opacification of anterior and posterior ethmoid air cells
and bilateral frontal sinuses.
2. Moderate mucosal thickening involving the dependent maxillary and
ethmoid air cells bilaterally.

## 2019-08-20 MED ORDER — MONTELUKAST SODIUM 10 MG PO TABS: ORAL_TABLET | ORAL | 3 refills | Status: DC

## 2019-08-20 MED ORDER — BUDESONIDE-FORMOTEROL FUMARATE 160-4.5 MCG/ACT IN AERO: INHALATION_SPRAY | RESPIRATORY_TRACT | 11 refills | Status: DC

## 2019-08-21 ENCOUNTER — Telehealth: Payer: Self-pay | Admitting: Internal Medicine

## 2019-08-21 ENCOUNTER — Other Ambulatory Visit: Payer: Self-pay | Admitting: *Deleted

## 2019-08-21 MED ORDER — BUDESONIDE-FORMOTEROL FUMARATE 160-4.5 MCG/ACT IN AERO: INHALATION_SPRAY | RESPIRATORY_TRACT | 11 refills | Status: DC

## 2019-08-21 NOTE — Telephone Encounter (Signed)
Patient reach out through my chart about symbicort 160 issue.  New prescription sent with no substitutions or generics in comments to pharmacy.  Patient was sent my chart message to let him know, new prescription sent.  Nothing further at this time.

## 2019-08-23 NOTE — Telephone Encounter (Signed)
Form placed in Dr Gustavus Bryant lookat to be signed (for formulary exception for generic symbicort) thanks

## 2019-08-23 NOTE — Telephone Encounter (Signed)
done

## 2020-02-20 ENCOUNTER — Ambulatory Visit: Payer: Medicare Other | Admitting: Internal Medicine

## 2020-02-22 ENCOUNTER — Ambulatory Visit: Payer: Medicare Other | Admitting: Internal Medicine

## 2020-03-21 ENCOUNTER — Ambulatory Visit: Payer: Medicare Other | Admitting: Internal Medicine

## 2020-03-24 ENCOUNTER — Encounter: Payer: Self-pay | Admitting: Internal Medicine

## 2020-03-24 ENCOUNTER — Other Ambulatory Visit: Payer: Self-pay

## 2020-03-24 ENCOUNTER — Ambulatory Visit (INDEPENDENT_AMBULATORY_CARE_PROVIDER_SITE_OTHER): Payer: Medicare Other | Admitting: Internal Medicine

## 2020-03-24 DIAGNOSIS — Z87891 Personal history of nicotine dependence: Secondary | ICD-10-CM | POA: Diagnosis not present

## 2020-03-24 DIAGNOSIS — J45991 Cough variant asthma: Secondary | ICD-10-CM

## 2020-03-24 NOTE — Patient Instructions (Signed)
Low-dose CT lung cancer screening is recommended for patients who are 103-75 years of age with a 30+ pack-year history of smoking, and who are currently smoking or quit <=15 years ago > discuss this with your PCP as you only have one more year of eligibility    Please schedule a follow up visit in 12  months but call sooner if needed

## 2020-03-24 NOTE — Progress Notes (Signed)
Subjective:   Patient ID: Juan House, male    DOB: Apr 17, 1946    MRN: [id number]    Brief patient profile:  21  yowm quit smoking 2006 and grew up in house full of smokers with tendency  Ear  infections as child never required surgery but as an adult started having problems with nasal congestion/ recurrent sinus infections in his 23s and required sinus surgery around 2013 and some better but then fairly soon after sinus surgery noted  more freq lower respiratory symptoms > recurrent cough/ pna 2016 > O'neal eval with CT 09/26/14 showed only emphysema rec prn proair but found pt didn't really need it and did fine until Feb 2019 when started out with cold  Then sob /cough better with proair and rx with prednisone /abx back to nl then same symptoms in May 2019 then again [facility] June 2019 abx/ prednisone all better but referred 02/13/2018 by Dr Thea Silversmith for ? Why recurrent bronchitis.   History of Present Illness  02/13/2018 1st [facility] office visit/   s present complaints  Chief Complaint  Patient presents with  . Pulmonary Consult    referred here by Dr. Thea Silversmith, recurrent bronchitis  last proair 2 weeks prior to OV   Last pred x June 22   and last doxy was February 06 2018 and feels fine right now Has 2 story house, walks a mile and a half every day, some doe x hills sats never lower than 88% rec Plan A = Automatic = symbicort 80 Take 2 puffs first thing in am and then another 2 puffs about 12 hours later.  Work on inhaler technique:  Plan B = Backup Only use your albuterol as a rescue medication   03/20/2018  f/u ov/ re: asthma cough variant  Chief Complaint  Patient presents with  . Follow-up    Breathing has improved some. He notices he is only SOB when he first wakes. He has not had to use his proair.   Dyspnea:  Not limited by breathing from desired activities - feels fine p am symbicort but delays taking the first 2 puffs instead of using first thing in am    Cough: none SABA use: not since started on symb  rec Try symbicort 160 Take 2 puffs first thing in am and then another 2 puffs about 12 hours later.  singulair 10 mg each pm can added for either worse upper or lower respiratory symptoms  If more hoarse on 160 go back to the 80 strength      06/20/2018  f/u ov/ re: on symb 80 2bid and singulair maint / worse hoarseness with sym 160 Chief Complaint  Patient presents with  . Follow-up    Breathing has improved to his normal baseline. He has not needed his albuterol inhaler.   Not limited by breathing from desired activities   No cough/ sleeping fine / no need for saba  rec No change    02/20/2019  f/u ov/ re: cough variant asthma/ symb 80 2bid / singulair  Chief Complaint  Patient presents with  . Follow-up    Breathing has been doing well overall. He has not used his rescue inhaler recently.   Dyspnea:  Walking 2 miles a day in one hour, some hills doe fine but thinks he was better on symb 160 vs 80 and wished to go back on the ghier strength  Cough: none Sleeping: no resp symptoms/ lies flat  SABA use:  rarely  02: none  rec Increase the symbicort to  160 Take 2 puffs first thing in am and then another 2 puffs about 12 hours later.  Work on inhaler technique:   Only use your albuterol as a rescue medication    03/24/2020  f/u ov/[place] office/ re: symbicort 160/singulair  Chief Complaint  Patient presents with  . Follow-up    No complaints  Dyspnea:  Walking 2 miles a day in 45 min with hills  Cough: none  Sleeping: one pillow /bed is flat  SABA use: none  02: none    No obvious day to day or daytime variability or assoc excess/ purulent sputum or mucus plugs or hemoptysis or cp or chest tightness, subjective wheeze or overt sinus or hb symptoms.   Sleeping  without nocturnal  or early am exacerbation  of respiratory  c/o's or need for noct saba. Also denies any obvious fluctuation of symptoms with  weather or environmental changes or other aggravating or alleviating factors except as outlined above   No unusual exposure hx or h/o childhood pna/ asthma or knowledge of premature birth.  Current Allergies, Complete Past Medical History, Past Surgical History, Family History, and Social History were reviewed in [employer] record.  ROS  The following are not active complaints unless bolded Hoarseness, sore throat, dysphagia, dental problems, itching, sneezing,  nasal congestion or discharge of excess mucus or purulent secretions, ear ache,   fever, chills, sweats, unintended wt loss related to UC or wt gain, classically pleuritic or exertional cp,  orthopnea pnd or arm/hand swelling  or leg swelling, presyncope, palpitations, abdominal pain, anorexia, nausea, vomiting, diarrhea  or change in bowel habits or change in bladder habits, change in stools or change in urine, dysuria, hematuria,  rash, arthralgias, visual complaints, headache, numbness, weakness or ataxia or problems with walking or coordination,  change in mood or  memory.        Current Meds  Medication Sig  . budesonide-formoterol (SYMBICORT) 160-4.5 MCG/ACT inhaler Take 2 puffs first thing in am and then another 2 puffs about 12 hours later.  . folic acid (FOLVITE) 1 MG tablet TK 1 T PO BID  . mesalamine (LIALDA) 1.2 g EC tablet   . montelukast (SINGULAIR) 10 MG tablet TAKE 1 TABLET(10 MG) BY MOUTH AT BEDTIME  . mupirocin nasal ointment (BACTROBAN) 2 % Nasal irrigation daily  . omeprazole (PRILOSEC) 20 MG capsule Take 20 mg by mouth 2 (two) times daily before a meal.  . PROAIR HFA 108 (90 Base) MCG/ACT inhaler Inhale 1-2 puffs into the lungs every 4 (four) hours as needed for wheezing or shortness of breath.  . Probiotic CAPS Take 1 capsule by mouth daily.  . tamsulosin (FLOMAX) 0.4 MG CAPS capsule TK 1 C PO D                    Objective:   Physical Exam  amb wm nad   03/24/2020         150    02/20/2019        154   06/20/18 160 lb (72.6 kg)  03/20/18 160 lb (72.6 kg)  02/13/18 161 lb 3.2 oz (73.1 kg)     Vital signs reviewed  03/24/2020  - Note at rest 02 sats  98% on RA    HEENT : pt wearing mask not removed for exam due to covid - 19 concerns.   NECK :  without JVD/Nodes/TM/ nl carotid  upstrokes bilaterally   LUNGS: no acc muscle use,  Min barrel  contour chest wall with bilateral  slightly decreased bs s audible wheeze and  without cough on insp or exp maneuvers and min  Hyperresonant  to  percussion bilaterally     CV:  RRR  no s3 or murmur or increase in P2, and no edema   ABD:  soft and nontender with pos end  insp Hoover's  in the supine position. No bruits or organomegaly appreciated, bowel sounds nl  MS:   Nl gait/  ext warm without deformities, calf tenderness, cyanosis or clubbing No obvious joint restrictions   SKIN: warm and dry without lesions    NEURO:  alert, approp, nl sensorium with  no motor or cerebellar deficits apparent.            Assessment & Plan:

## 2020-03-24 NOTE — Assessment & Plan Note (Signed)
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   He is eligible thru this year for screening  Discussed in detail all the  indications, usual  risks and alternatives  relative to the benefits with patient who wants to discuss it with his PCP in [place] which is fine with me.

## 2020-03-24 NOTE — Assessment & Plan Note (Addendum)
FENO 02/13/2018  =   294 on no rx  - Spirometry 02/13/2018  FEV1 3.29 (99%)  Ratio 80 on no rx  - Allergy profile 02/13/2018 >  Eos 0.4 /  IgE  1745  Grass, trees, dust  - 02/13/2018  After extensive coaching inhaler device  effectiveness =    90% > try symbicort 80 2bid  - Sinus CT 03/20/2018 >>>  1. Diffuse opacification of anterior and posterior ethmoid air cells and bilateral frontal sinuses. 2. Moderate mucosal thickening involving the dependent maxillary and ethmoid air cells bilaterally - FENO 03/20/2018  =   98 on 80 bid > increased to 160 2bid but changed on his own back to 80 - 02/20/2019    increased back to symb 160 2bid at pt request  - 03/24/2020  After extensive coaching inhaler device,  effectiveness =    90% and great control of symptoms so rec continue symb 160/singulair and f/u yearly    All goals of chronic asthma control met including optimal function and elimination of symptoms with minimal need for rescue therapy.  Contingencies discussed in full including contacting this office immediately if not controlling the symptoms using the rule of two's.     >>> f/u can be yearly at this point.           Each maintenance medication was reviewed in detail including emphasizing most importantly the difference between maintenance and prns and under what circumstances the prns are to be triggered using an action plan format where appropriate.  Total time for H and P, chart review, counseling, teaching device and generating customized AVS unique to this office visit / charting = 22 min

## 2020-04-07 LAB — HM COLONOSCOPY

## 2020-08-25 MED ORDER — BUDESONIDE-FORMOTEROL FUMARATE 160-4.5 MCG/ACT IN AERO: INHALATION_SPRAY | RESPIRATORY_TRACT | 6 refills | Status: DC

## 2020-08-25 MED ORDER — MONTELUKAST SODIUM 10 MG PO TABS: ORAL_TABLET | ORAL | 3 refills | Status: DC

## 2020-11-11 NOTE — Telephone Encounter (Signed)
Unless new symptoms no need for f/u until Aug 2022 as planned

## 2020-11-11 NOTE — Telephone Encounter (Signed)
Dr Melvyn Novas- please review records (ct reports) that are in your lookat in pod [facility] and advise if you need to see pt sooner than planned f/u Aug 2022. Thanks.

## 2021-04-07 DIAGNOSIS — N400 Enlarged prostate without lower urinary tract symptoms: Secondary | ICD-10-CM | POA: Insufficient documentation

## 2021-04-07 DIAGNOSIS — H938X9 Other specified disorders of ear, unspecified ear: Secondary | ICD-10-CM | POA: Insufficient documentation

## 2021-05-04 ENCOUNTER — Encounter: Payer: Self-pay | Admitting: Internal Medicine

## 2021-05-04 ENCOUNTER — Ambulatory Visit (INDEPENDENT_AMBULATORY_CARE_PROVIDER_SITE_OTHER): Payer: Medicare Other | Admitting: Internal Medicine

## 2021-05-04 ENCOUNTER — Other Ambulatory Visit: Payer: Self-pay

## 2021-05-04 DIAGNOSIS — J45991 Cough variant asthma: Secondary | ICD-10-CM | POA: Diagnosis not present

## 2021-05-04 MED ORDER — BUDESONIDE-FORMOTEROL FUMARATE 160-4.5 MCG/ACT IN AERO: INHALATION_SPRAY | RESPIRATORY_TRACT | 11 refills | Status: DC

## 2021-05-04 NOTE — Assessment & Plan Note (Addendum)
FENO 02/13/2018  =   294 on no rx  - Spirometry 02/13/2018  FEV1 3.29 (99%)  Ratio 80 on no rx  - Allergy profile 02/13/2018 >  Eos 0.4 /  IgE  1745  Grass, trees, dust  - 02/13/2018  After extensive coaching inhaler device  effectiveness =    90% > try symbicort 80 2bid  - Sinus CT 03/20/2018 >>>  1. Diffuse opacification of anterior and posterior ethmoid air cells and bilateral frontal sinuses. 2. Moderate mucosal thickening involving the dependent maxillary and ethmoid air cells bilaterally - FENO 03/20/2018  =   98 on 80 bid > increased to 160 2bid but changed on his own back to 80 - 02/20/2019    increased back to symb 160 2bid at pt request  - 03/24/2020  great control of symptoms so rec continue symb 160/singulair and f/u yearly   - 05/04/2021  After extensive coaching inhaler device,  effectiveness =    90%   All goals of chronic asthma control met including optimal function and elimination of symptoms with minimal need for rescue therapy on sym 160 2bid and singulair   Contingencies discussed in full including contacting this office immediately if not controlling the symptoms using the rule of two's.     >>> f/u yearly, call sooner if needed         Each maintenance medication was reviewed in detail including emphasizing most importantly the difference between maintenance and prns and under what circumstances the prns are to be triggered using an action plan format where appropriate.  Total time for H and P, chart review, counseling, reviewing when to use which inhaler devices, and generating customized AVS unique to this office visit / same day charting = 25 min

## 2021-05-04 NOTE — Progress Notes (Signed)
Subjective:   Patient ID: Juan House, male    DOB: 30-Jan-1946    MRN: [id number]    Brief patient profile:  15  yowm quit smoking 2006 and grew up in house full of smokers with tendency  Ear  infections as child never required surgery but as an adult started having problems with nasal congestion/ recurrent sinus infections in his 81s and required sinus surgery around 2013 and some better but then fairly soon after sinus surgery noted  more freq lower respiratory symptoms > recurrent cough/ pna 2016 > O'neal eval with CT 09/26/14 showed only emphysema rec prn proair but found pt didn't really need it and did fine until Feb 2019 when started out with cold  Then sob /cough better with proair and rx with prednisone /abx back to nl then same symptoms in May 2019 then again [facility] June 2019 abx/ prednisone all better but referred 02/13/2018 by Dr Thea Silversmith for ? Why recurrent bronchitis.   History of Present Illness  02/13/2018 1st [facility] office visit/   s present complaints  Chief Complaint  Patient presents with   Pulmonary Consult    referred here by Dr. Thea Silversmith, recurrent bronchitis  last proair 2 weeks prior to OV   Last pred x June 22   and last doxy was February 06 2018 and feels fine right now Has 2 story house, walks a mile and a half every day, some doe x hills sats never lower than 88% rec Plan A = Automatic = symbicort 80 Take 2 puffs first thing in am and then another 2 puffs about 12 hours later.  Work on inhaler technique:  Plan B = Backup Only use your albuterol as a rescue medication   03/24/2020  f/u ov/[place] office/ re: symbicort 160/singulair  Chief Complaint  Patient presents with   Follow-up    No complaints  Dyspnea:  Walking 2 miles a day in 45 min with hills  Cough: none  Sleeping: one pillow /bed is flat  SABA use: none  02: none  Rec No change    05/04/2021  f/u ov/[place] office/ re: cough variant asthma maint on symb  160 /singulair with abn CT chest  Chief Complaint  Patient presents with   Follow-up    Patient reports that he has had no changes since last visit. No SOB. No cough.   Covid + on 04/21/21   Dyspnea:  not affected by covid/ just st nasal congestions  Cough: none  Sleeping: none/ flat bed with 2 pillows  SABA use: none  02: none  Covid status: vax x 4/ infected Sept 2022 Lung cancer screening: done    No obvious day to day or daytime variability or assoc excess/ purulent sputum or mucus plugs or hemoptysis or cp or chest tightness, subjective wheeze or overt sinus or hb symptoms.   Sleeping  without nocturnal  or early am exacerbation  of respiratory  c/o's or need for noct saba. Also denies any obvious fluctuation of symptoms with weather or environmental changes or other aggravating or alleviating factors except as outlined above   No unusual exposure hx or h/o childhood pna/ asthma or knowledge of premature birth.  Current Allergies, Complete Past Medical History, Past Surgical History, Family History, and Social History were reviewed in [employer] record.  ROS  The following are not active complaints unless bolded Hoarseness, sore throat, dysphagia, dental problems, itching, sneezing,  nasal congestion or discharge of excess  mucus or purulent secretions, ear ache,   fever, chills, sweats, unintended wt loss or wt gain, classically pleuritic or exertional cp,  orthopnea pnd or arm/hand swelling  or leg swelling, presyncope, palpitations, abdominal pain, anorexia, nausea, vomiting, diarrhea  or change in bowel habits or change in bladder habits, change in stools or change in urine, dysuria, hematuria,  rash, arthralgias, visual complaints, headache, numbness, weakness or ataxia or problems with walking or coordination,  change in mood or  memory.        Current Meds  Medication Sig   budesonide-formoterol (SYMBICORT) 160-4.5 MCG/ACT inhaler Take 2 puffs first  thing in am and then another 2 puffs about 12 hours later.   folic acid (FOLVITE) 1 MG tablet TK 1 T PO BID   mesalamine (LIALDA) 1.2 g EC tablet    montelukast (SINGULAIR) 10 MG tablet TAKE 1 TABLET(10 MG) BY MOUTH AT BEDTIME   omeprazole (PRILOSEC) 20 MG capsule Take 20 mg by mouth 2 (two) times daily before a meal.   PROAIR HFA 108 (90 Base) MCG/ACT inhaler Inhale 1-2 puffs into the lungs every 4 (four) hours as needed for wheezing or shortness of breath.   Probiotic CAPS Take 1 capsule by mouth daily.   tamsulosin (FLOMAX) 0.4 MG CAPS capsule TK 1 C PO D                     Objective:   Physical Exam    05/04/2021       144  03/24/2020         150   02/20/2019        154   06/20/18 160 lb (72.6 kg)  03/20/18 160 lb (72.6 kg)  02/13/18 161 lb 3.2 oz (73.1 kg)    Vital signs reviewed  05/04/2021  - Note at rest 02 sats  100% on RA   General appearance:    amb pleasant wm nad   HEENT : pt wearing mask not removed for exam due to covid - 19 concerns.   NECK :  without JVD/Nodes/TM/ nl carotid upstrokes bilaterally   LUNGS: no acc muscle use,  Min barrel  contour chest wall with bilateral  slightly decreased bs s audible wheeze and  without cough on insp or exp maneuvers and min  Hyperresonant  to  percussion bilaterally     CV:  RRR  no s3 or murmur or increase in P2, and no edema   ABD:  soft and nontender with pos end  insp Hoover's  in the supine position. No bruits or organomegaly appreciated, bowel sounds nl  MS:   Nl gait/  ext warm without deformities, calf tenderness, cyanosis or clubbing No obvious joint restrictions   SKIN: warm and dry without lesions    NEURO:  alert, approp, nl sensorium with  no motor or cerebellar deficits apparent.         I personally reviewed images and agree with radiology impression as follows:   Chest CT 11/05/20  Copd/ mild ILD,  single bullous lesion forming "cavity" s  change vs priors  rec repeat in 6 m> planned      Assessment & Plan:

## 2021-05-04 NOTE — Patient Instructions (Signed)
Plan A = Automatic = Always=    Symbicort 160 Take 2 puffs first thing in am and then another 2 puffs about 12 hours later and singulair one each evening   Plan B = Backup (to supplement plan A, not to replace it) Only use your albuterol inhaler as a rescue medication to be used if you can't catch your breath by resting or doing a relaxed purse lip breathing pattern.  - The less you use it, the better it will work when you need it. - Ok to use the inhaler up to 2 puffs  every 4 hours if you must but call for appointment if use goes up over your usual need - Don't leave home without it !!  (think of it like the spare tire for your car)   Please schedule a follow up visit in  12  months but call sooner if needed

## 2021-08-17 ENCOUNTER — Encounter: Payer: Self-pay | Admitting: Internal Medicine

## 2021-08-18 MED ORDER — MONTELUKAST SODIUM 10 MG PO TABS: ORAL_TABLET | ORAL | 3 refills | Status: DC

## 2021-11-14 HISTORY — PX: OTHER SURGICAL HISTORY: SHX169

## 2021-11-14 HISTORY — PX: DRUG INDUCED ENDOSCOPY: SHX6808

## 2021-11-25 LAB — HM SIGMOIDOSCOPY

## 2022-02-17 HISTORY — PX: NASAL SINUS SURGERY: SHX719

## 2022-04-28 ENCOUNTER — Ambulatory Visit (INDEPENDENT_AMBULATORY_CARE_PROVIDER_SITE_OTHER): Payer: Medicare Other | Admitting: Internal Medicine

## 2022-04-28 ENCOUNTER — Encounter: Payer: Self-pay | Admitting: Internal Medicine

## 2022-04-28 DIAGNOSIS — J45991 Cough variant asthma: Secondary | ICD-10-CM

## 2022-04-28 MED ORDER — PROAIR HFA 108 (90 BASE) MCG/ACT IN AERS: 1.0000 | INHALATION_SPRAY | RESPIRATORY_TRACT | 1 refills | Status: DC | PRN

## 2022-04-28 MED ORDER — MONTELUKAST SODIUM 10 MG PO TABS: ORAL_TABLET | ORAL | 3 refills | Status: DC

## 2022-04-28 MED ORDER — BUDESONIDE-FORMOTEROL FUMARATE 160-4.5 MCG/ACT IN AERO: INHALATION_SPRAY | RESPIRATORY_TRACT | 11 refills | Status: DC

## 2022-04-28 NOTE — Progress Notes (Signed)
Subjective:   Patient ID: Juan House, male    DOB: 1946/03/03    MRN: [id number]    Brief patient profile:  89  yowm quit smoking 2006 and grew up in house full of smokers with tendency  Ear  infections as child never required surgery but as an adult started having problems with nasal congestion/ recurrent sinus infections in his 60s and required sinus surgery around 2013 and some better but then fairly soon after sinus surgery noted  more freq lower respiratory symptoms > recurrent cough/ pna 2016 > O'neal eval with CT 09/26/14 showed only emphysema rec prn proair but found pt didn't really need it and did fine until Feb 2019 when started out with cold  Then sob /cough better with proair and rx with prednisone /abx back to nl then same symptoms in May 2019 then again [facility] June 2019 abx/ prednisone all better but referred 02/13/2018 by Dr Thea Silversmith for ? Why recurrent bronchitis.   History of Present Illness  02/13/2018 1st [facility] office visit/   s present complaints  Chief Complaint  Patient presents with   Pulmonary Consult    referred here by Dr. Thea Silversmith, recurrent bronchitis  last proair 2 weeks prior to OV   Last pred x June 22   and last doxy was February 06 2018 and feels fine right now Has 2 story house, walks a mile and a half every day, some doe x hills sats never lower than 88% rec Plan A = Automatic = symbicort 80 Take 2 puffs first thing in am and then another 2 puffs about 12 hours later.  Work on inhaler technique:  Plan B = Backup Only use your albuterol as a rescue medication       05/04/2021  f/u ov/[place] office/ re: cough variant asthma maint on symb 160 /singulair with abn CT chest  Chief Complaint  Patient presents with   Follow-up    Patient reports that he has had no changes since last visit. No SOB. No cough.   Covid + on 04/21/21   Dyspnea:  not affected by covid/ just st nasal congestions  Cough: none  Sleeping: none/ flat  bed with 2 pillows  SABA use: none  02: none  Covid status: vax x 4/ infected Sept 2022 Lung cancer screening: done  Rec Plan A = Automatic = Always=    Symbicort 160 Take 2 puffs first thing in am and then another 2 puffs about 12 hours later and singulair one each evening  Plan B = Backup (to supplement plan A, not to replace it) Only use your albuterol inhaler as a rescue medication       04/28/2022  f/u ov/[place] office/ re: cough variant asthma  maint on symbicort / singulair   Chief Complaint  Patient presents with   Follow-up    He is doing well since last OV.    Dyspnea:  Not limited by breathing from desired activities  / walking daily x 30 min some hills Cough: none Sleeping: fine no resp c  SABA use: none  02: none       No obvious day to day or daytime variability or assoc excess/ purulent sputum or mucus plugs or hemoptysis or cp or chest tightness, subjective wheeze or overt sinus or hb symptoms.   sleeping without nocturnal  or early am exacerbation  of respiratory  c/o's or need for noct saba. Also denies any obvious fluctuation of symptoms with  weather or environmental changes or other aggravating or alleviating factors except as outlined above   No unusual exposure hx or h/o childhood pna/ asthma or knowledge of premature birth.  Current Allergies, Complete Past Medical History, Past Surgical History, Family History, and Social History were reviewed in [employer] record.  ROS  The following are not active complaints unless bolded Hoarseness, sore throat, dysphagia, dental problems, itching, sneezing,  nasal congestion or discharge of excess mucus or purulent secretions, ear ache,   fever, chills, sweats, unintended wt loss or wt gain, classically pleuritic or exertional cp,  orthopnea pnd or arm/hand swelling  or leg swelling, presyncope, palpitations, abdominal pain, anorexia, nausea, vomiting, diarrhea  or change in bowel habits or  change in bladder habits, change in stools or change in urine, dysuria, hematuria,  rash, arthralgias, visual complaints, headache, numbness, weakness or ataxia or problems with walking or coordination,  change in mood or  memory.        Current Meds  Medication Sig   Azelastine HCl 137 MCG/SPRAY SOLN Place 1 spray into both nostrils 2 (two) times daily.   budesonide-formoterol (SYMBICORT) 160-4.5 MCG/ACT inhaler Take 2 puffs first thing in am and then another 2 puffs about 12 hours later.   folic acid (FOLVITE) 1 MG tablet TK 1 T PO BID   latanoprost (XALATAN) 0.005 % ophthalmic solution Place 1 drop into both eyes at bedtime.   mesalamine (LIALDA) 1.2 g EC tablet    montelukast (SINGULAIR) 10 MG tablet TAKE 1 TABLET(10 MG) BY MOUTH AT BEDTIME   omeprazole (PRILOSEC) 20 MG capsule Take 20 mg by mouth 2 (two) times daily before a meal.   PROAIR HFA 108 (90 Base) MCG/ACT inhaler Inhale 1-2 puffs into the lungs every 4 (four) hours as needed for wheezing or shortness of breath.   Probiotic CAPS Take 1 capsule by mouth daily.   rOPINIRole (REQUIP) 0.25 MG tablet Take 0.25 mg by mouth at bedtime.   tamsulosin (FLOMAX) 0.4 MG CAPS capsule TK 1 C PO D                      Objective:   Physical Exam  Wts  04/28/2022       151 05/04/2021       144  03/24/2020         150   02/20/2019        154   06/20/18 160 lb (72.6 kg)  03/20/18 160 lb (72.6 kg)  02/13/18 161 lb 3.2 oz (73.1 kg)    Vital signs reviewed  04/28/2022  - Note at rest 02 sats  99% on RA   General appearance:    amb well preserved wm nad      HEENT : Oropharynx  clear   Nasal turbinates nl   NECK :  without  apparent JVD/ palpable Nodes/TM    LUNGS: no acc muscle use,  Min barrel  contour chest wall with bilateral  slightly decreased bs s audible wheeze and  without cough on insp or exp maneuvers and min  Hyperresonant  to  percussion bilaterally    CV:  RRR  no s3 or murmur or increase in P2, and no edema   ABD:   soft and nontender with pos end  insp Hoover's  in the supine position.  No bruits or organomegaly appreciated   MS:  Nl gait/ ext warm without deformities Or obvious joint restrictions  calf tenderness, cyanosis or  clubbing     SKIN: warm and dry without lesions    NEURO:  alert, approp, nl sensorium with  no motor or cerebellar deficits apparent.              I personally reviewed images and agree with radiology impression as follows:   Chest CT 11/05/20  Copd/ mild ILD,  single bullous lesion forming "cavity" s  change vs priors  rec repeat in 6 m> planned     Assessment & Plan:

## 2022-04-28 NOTE — Assessment & Plan Note (Addendum)
FENO 02/13/2018  =   294 on no rx  - Spirometry 02/13/2018  FEV1 3.29 (99%)  Ratio 80 on no rx  - Allergy profile 02/13/2018 >  Eos 0.4 /  IgE  1745  Grass, trees, dust  - 02/13/2018  After extensive coaching inhaler device  effectiveness =    90% > try symbicort 80 2bid  - Sinus CT 03/20/2018 >>>  1. Diffuse opacification of anterior and posterior ethmoid air cells and bilateral frontal sinuses. 2. Moderate mucosal thickening involving the dependent maxillary and ethmoid air cells bilaterally - FENO 03/20/2018  =   98 on 80 bid > increased to 160 2bid but changed on his own back to 80 - 02/20/2019    increased back to symb 160 2bid at pt request  - 03/24/2020  great control of symptoms so rec continue symb 160/singulair and f/u yearly   - 05/04/2021  After extensive coaching inhaler device,  effectiveness =    90%   All goals of chronic asthma control met including optimal function and elimination of symptoms with minimal need for rescue therapy.  Contingencies discussed in full including contacting this office immediately if not controlling the symptoms using the rule of two's.      Ok to drop off pm dose of symbicort as long as still doing great.  F/u yearly - call sooner prn          Each maintenance medication was reviewed in detail including emphasizing most importantly the difference between maintenance and prns and under what circumstances the prns are to be triggered using an action plan format where appropriate.  Total time for H and P, chart review, counseling, reviewing hfa  device(s) and generating customized AVS unique to this office visit / same day charting = 24 min

## 2022-04-28 NOTE — Patient Instructions (Signed)
Ok to hold off on the night time dose of symbicort as long as you doing great  Please schedule a follow up visit in 12  months but call sooner if needed

## 2022-04-29 ENCOUNTER — Telehealth: Payer: Self-pay | Admitting: Internal Medicine

## 2022-04-30 NOTE — Telephone Encounter (Signed)
The respiclick is a different device so I would favor the ventolin or generic albuterol over the respiclick or digihaler

## 2022-04-30 NOTE — Telephone Encounter (Signed)
ATC LVMTCB x 1  

## 2022-04-30 NOTE — Telephone Encounter (Signed)
Please advise on medication changes

## 2022-05-03 NOTE — Telephone Encounter (Signed)
Called and left voicemail for patient to call our office back if they are still needing help from our office,. Closing encounter due to multiple times to call patient. Nothing further needed

## 2022-05-17 ENCOUNTER — Encounter: Payer: Self-pay | Admitting: Internal Medicine

## 2022-05-17 ENCOUNTER — Other Ambulatory Visit: Payer: Self-pay | Admitting: Internal Medicine

## 2022-05-17 NOTE — Telephone Encounter (Signed)
Not the same drug - respiclick is a powder Rec generic albuterol hfa same rx and sig and #

## 2022-05-17 NOTE — Telephone Encounter (Signed)
DR. Melvyn Novas sent a prescription to [facility], [place], [address] [place], [place] for ProAir [facility] Inhaler. According to Pharmacy, this is no longer available.  It has been replaced by ProAir Digi Haler? The prescription was for 2.   Please send in New Prescription if Dr. Melvyn Novas is in agreement with this change.   Dr. Melvyn Novas please advise

## 2022-05-18 ENCOUNTER — Other Ambulatory Visit (HOSPITAL_COMMUNITY): Payer: Self-pay

## 2022-05-18 MED ORDER — ALBUTEROL SULFATE HFA 108 (90 BASE) MCG/ACT IN AERS: 2.0000 | INHALATION_SPRAY | Freq: Four times a day (QID) | RESPIRATORY_TRACT | 5 refills | Status: AC | PRN

## 2022-05-18 NOTE — Telephone Encounter (Signed)
Pharmacy, when trying to order the albuterol hfa it states the generic albuterol hfa is non formulary. Are there any albuterol inhalers that are on the pt's formulary and what would be the out of pocket cost for the generic albuterol hfa? Thanks.

## 2022-05-18 NOTE — Telephone Encounter (Signed)
Per test claims it seems that the generic Proventil HFA albuterol is the covered product. For one inhaler (6.7g) it is a $32.61 co-pay.

## 2022-05-19 NOTE — Telephone Encounter (Signed)
Ok to use levalbuterol which has the albuterol in it  same rx

## 2022-05-19 NOTE — Telephone Encounter (Signed)
"  [employer] does NOT cover this medication.  They do cover the following:  Levalbuterol Inhaler HFA 200 @$33.42 or Proair Respi Inhaler @$47.00.  Please advise.  Thanks, for your helping me."   Please advise dr. Melvyn Novas

## 2022-05-21 LAB — LAB REPORT - SCANNED: EGFR: 66

## 2022-07-26 ENCOUNTER — Encounter (INDEPENDENT_AMBULATORY_CARE_PROVIDER_SITE_OTHER): Payer: Self-pay | Admitting: Gastroenterology

## 2022-07-26 ENCOUNTER — Other Ambulatory Visit (INDEPENDENT_AMBULATORY_CARE_PROVIDER_SITE_OTHER): Payer: Self-pay | Admitting: Internal Medicine

## 2022-07-26 ENCOUNTER — Ambulatory Visit (INDEPENDENT_AMBULATORY_CARE_PROVIDER_SITE_OTHER): Payer: Medicare Other | Admitting: Gastroenterology

## 2022-07-26 VITALS — BP 138/77 | HR 59 | Temp 97.7°F | Ht 71.0 in | Wt 148.4 lb

## 2022-07-26 DIAGNOSIS — K2270 Barrett's esophagus without dysplasia: Secondary | ICD-10-CM | POA: Diagnosis not present

## 2022-07-26 DIAGNOSIS — K5130 Ulcerative (chronic) rectosigmoiditis without complications: Secondary | ICD-10-CM | POA: Diagnosis not present

## 2022-07-26 DIAGNOSIS — K5190 Ulcerative colitis, unspecified, without complications: Secondary | ICD-10-CM | POA: Insufficient documentation

## 2022-07-26 MED ORDER — DICYCLOMINE HCL 10 MG PO CAPS: 10.0000 mg | ORAL_CAPSULE | Freq: Two times a day (BID) | ORAL | 1 refills | Status: DC | PRN

## 2022-07-26 NOTE — Progress Notes (Addendum)
Referring Provider: Earney Mallet, MD Primary Care Physician:  Earney Mallet, MD Primary GI Physician: new   Chief Complaint  Patient presents with   Diarrhea    New patient. Wanted to transfer GI care from [place] to here. Doing well now but has had c diff in the past and some issues with diarrhea.    HPI:   Juan House is a 76 y.o. 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   Patient presenting today as a new patient for Ulcerative rectosigmoiditis and Barrett's esophagus   Currently on lialda 1.2g TID. UC diagnosed in June 2017.   Present: Patient states that he is not happy with [facility]. He reports that he has history of UC since 2017. Initially on sulfasalzine in 2017 which he did not tolerate, then went to [facility] and did okay with this. Transitioned to Lialda which was initially $800 per three months but now he is getting patient assistance. Reports that he was told to take whatever dosage he thought he needed so he is taking Lialda TID with good results. Takes imodium once daily with good results. Feels he has gotten little to no guidance on his disease with them.  Notes that he saw Dr. Posey Pronto in September for diarrhea with 4-5 episodes per day(no rectal bleeding or abd pain at the time), was given Dificid for suspected C diff, which testing later showed was negative. He states that he did not get the dificid due to cost. He called back as he was still having issues with diarrhea and had an upcoming trip. He had a lot of trouble getting back in to see them or getting meds as he felt he was having a flare. He had to cancel a trip to [place] due to this. He states they would not give him prednisone at that time, despite appearing to be in a UC flare. symptoms eventually resolved. He notes no significant flares since  September.   He reports that he can have BMs anywhere from 0-3 per day. Sometimes can go a day or two without a BM. Stools are mostly formed. Has an occasional loose stool maybe once per month which he takes imodium for with good results. He can still have some fecal urgency, especially after breakfast, denies fecal incontinence. Denies blood or black colored stools. No abdominal pain. Appetite is good. Notes that he lost about 50 lbs since onset of disease but has maintained for the past few years. He is taking a daily probiotic and eating yogurt daily as well.   Reports some fecal urgency which they put him on budesonide for previously without much results. Has been given dicyclomine in the past but was told it could cause constipation so he did not take it. He is avoiding all red meats, tomato based foods, any NSAIDs, alcohol or processed foods. states they could not tell him any info on dietary changes to implement so he researched and did these changes on his own.   Does labs with PCP usually every few months, last done in September.   History of Barrett's esophagus, though last EGD without evidence of Barrett's esophagus. He has never had many GERD symptoms. Is maintained on omeprazole 67m BID  He takes folic acid daily, is up to date on flu vaccine, RSV, covid boosters and pneumonia vaccine as of this year.   Extraintestinal Manifestations: Skin: no rashes  or lesions  Joints: no joint pain or swelling  Eyes: no changes in eyesight or vision issues  NSAID WCB:JSEG  Social hx: no etoh, no tobacco  Fam hx: mother had CRC at 7 then developed pancreatic cancer later which she passed from   Last Colonoscopy:August 2021, hyperplastic polyp rectum, adenomatous polyp hepatic flexure, normal biopsies  Last sigmoidoscopy: April 2023 moderate diverticulosis, normal mucosa, biopsies taken-negative Last Endoscopy: April 2023, normal duodenum, normal mucosa in esophagus, erythema and edema, marked  inflammation in antrum compatible with non erosive gastritis. ( Stomach bx showed reactive foveolar hyperplasia, stromal fibrosis and focal chronic inflammation, no h pylori, esophagus bx showed chronic inflammation, negative for barrett's or dysplasia, squamous mucosa with reactive epithelial changes)   Recommendations:  Repeat ED 2028, Colonoscopy in 2026  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   Enlarged prostate    Gilbert's syndrome    Goiter    Kidney cysts    Kidney stones 2012, 2016   Perforation of bladder 04/04/2017   Stroke ([place]) 08/16/2004   Ulcerative colitis ([place]) 2017    Past Surgical History:  Procedure Laterality Date   CHOLECYSTECTOMY     colon fistula     DRUG INDUCED ENDOSCOPY  11/2021   HERNIA REPAIR Bilateral 1992   HERNIA REPAIR  03/2013   hernia, left side along with sperm cord lesion - Dr. Herschell Dimes, [place]   NASAL SINUS SURGERY Right 02/17/2022   growth right nasal passage   PROSTATE BIOPSY  2012   sigmodscopy  11/2021    Current Outpatient Medications  Medication Sig Dispense Refill   albuterol (VENTOLIN HFA) 108 (90 Base) MCG/ACT inhaler Inhale 2 puffs into the lungs every 6 (six) hours as needed for wheezing or shortness of breath. 8 g 5   Azelastine HCl 137 MCG/SPRAY SOLN Place 1 spray into both nostrils 2 (two) times daily.     budesonide-formoterol (SYMBICORT) 160-4.5 MCG/ACT inhaler Take 2 puffs first thing in am and then another 2 puffs about 12 hours later. 1 each 11   folic acid (FOLVITE) 1 MG tablet TK 1 T PO BID     latanoprost (XALATAN) 0.005 % ophthalmic solution Place 1 drop into both eyes at bedtime.     mesalamine (LIALDA) 1.2 g EC tablet 1.2 g. 3 tablets daily with breakfast     montelukast (SINGULAIR) 10 MG tablet TAKE 1 TABLET(10 MG) BY MOUTH AT BEDTIME 90 tablet 3   omeprazole (PRILOSEC) 20 MG capsule Take 20 mg by mouth 2 (two) times daily before a meal.     PROAIR  HFA 108 (90 Base) MCG/ACT inhaler Inhale 1-2 puffs into the lungs every 4 (four) hours as needed for wheezing or shortness of breath. 1 g 1   Probiotic CAPS Take 1 capsule by mouth daily.     rOPINIRole (REQUIP) 0.5 MG tablet Take 0.5 mg by mouth at bedtime.     tamsulosin (FLOMAX) 0.4 MG CAPS capsule TK 1 C PO D     No current facility-administered medications for this visit.    Allergies as of 07/26/2022 - Review Complete 04/28/2022  Allergen Reaction Noted   Sulfasalazine  07/26/2022    Family History  Problem Relation Age of Onset   Cancer - Colon Mother    Pancreatic cancer Mother     Social History   Socioeconomic History   Marital status: Married    Spouse name: Not  on file   Number of children: Not on file   Years of education: Not on file   Highest education level: Not on file  Occupational History   Not on file  Tobacco Use   Smoking status: Former    Packs/day: 1.00    Years: 35.00    Total pack years: 35.00    Types: Cigarettes    Quit date: 08/16/2004    Years since quitting: 17.9    Passive exposure: Past   Smokeless tobacco: Never  Substance and Sexual Activity   Alcohol use: Never   Drug use: Never   Sexual activity: Not on file  Other Topics Concern   Not on file  Social History Narrative   Not on file   Social Determinants of Health   Financial Resource Strain: Not on file  Food Insecurity: Not on file  Transportation Needs: Not on file  Physical Activity: Not on file  Stress: Not on file  Social Connections: Not on file   Review of systems General: negative for malaise, night sweats, fever, chills, weight los Neck: Negative for lumps, goiter, pain and significant neck swelling Resp: Negative for cough, wheezing, dyspnea at rest CV: Negative for chest pain, leg swelling, palpitations, orthopnea GI: denies melena, hematochezia, nausea, vomiting, diarrhea, constipation, dysphagia, odyonophagia, early satiety or unintentional weight loss.  +occasional diarrhea  MSK: Negative for joint pain or swelling, back pain, and muscle pain. Derm: Negative for itching or rash Psych: Denies depression, anxiety, memory loss, confusion. No homicidal or suicidal ideation.  Heme: Negative for prolonged bleeding, bruising easily, and swollen nodes. Endocrine: Negative for cold or heat intolerance, polyuria, polydipsia and goiter. Neuro: negative for tremor, gait imbalance, syncope and seizures. The remainder of the review of systems is noncontributory.  Physical Exam: BP 138/77 (BP Location: Left Arm, Patient Position: Sitting, Cuff Size: Normal)   Pulse (!) 59   Temp 97.7 F (36.5 C) (Oral)   Ht 5' 11"  (1.803 m)   Wt 148 lb 6.4 oz (67.3 kg)   BMI 20.70 kg/m  General:   Alert and oriented. No distress noted. Pleasant and cooperative.  Head:  Normocephalic and atraumatic. Eyes:  Conjuctiva clear without scleral icterus. Mouth:  Oral mucosa pink and moist. Good dentition. No lesions. Heart: Normal rate and rhythm, s1 and s2 heart sounds present.  Lungs: Clear lung sounds in all lobes. Respirations equal and unlabored. Abdomen:  +BS, soft, non-tender and non-distended. No rebound or guarding. No HSM or masses noted. Derm: No palmar erythema or jaundice Msk:  Symmetrical without gross deformities. Normal posture. Extremities:  Without edema. Neurologic:  Alert and  oriented x4 Psych:  Alert and cooperative. Normal mood and affect.  Invalid input(s): "6 MONTHS"   ASSESSMENT: Juan House is a 76 y.o. male presenting today as a new patient for Barrett's esophagus and UC.  UC: diagnosed in 2017, maintained on Lialda 1.2g TID with last colonoscopy in 2021 without active disease, flex sig earlier this year without active disease. He has 0-3 BMs per day, mostly formed, occasional loose stool maybe once per month which he takes imodium for.  He avoid red meats, tomato based, spicy foods, alcohol, NSAIDs and processed foods. He denies rectal  bleeding or abdominal pain. Has some fecal urgency which has been ongoing, usually occurring in the morning after breakfast, could have some aspect of IBS. Will start dicyclomine 91m BID PRN. I advised him that in low doses, this should not cause constipation. Will also provide low FODMAP  diet though his current dietary habits sound in pretty close alignment with this already. We will check CRP and Fecal calprotectin and obtain recent basic labs from PCP.   Barrett's Esophagus: last EGD in April 2023 without findings of Barrett's, he has no GERD symptoms, will try decreasing PPI down to once daily. If he has breakthrough symptoms, will go back to BID. No dysphagia or odynophagia.    PLAN:  Continue lialda 1.2g TID 2. Low FODMAP diet and current dietary habits 3. Rx dicyclomine 10 mg PRN BID  4. CRP and fecal calprotectin  5. Obtain labs from PCP 6. Decrease omeprazole 25m to once daily  All questions were answered, patient verbalized understanding and is in agreement with plan as outlined above.    Follow Up: 6 months    L. CAlver Sorrow MSN, APRN, AGNP-C Adult-Gerontology Nurse Practitioner [facility]First [facility] GI [facility]  I have reviewed the note and agree with the APP's assessment as described in this progress note  DMaylon Peppers MD Gastroenterology and Hepatology [facility]astroenterology

## 2022-07-26 NOTE — Patient Instructions (Addendum)
It was nice to meet you! For now we will continue on Lialda at current dosage You can use imodium as needed, I am sending dicyclomine 25m to be taken also as needed for fecal urgency, however, this usually works best when taken about 45 minutes to  1 hour prior to your meals Continue with your current dietary habits, I am providing the low FODMAP diet which is likely similar to what you are already doing We will check inflammatory markers and get recent labs from your PCP You can try decreasing Omeprazole 252mdown to once daily, if you are having any breakthrough heartburn or acid reflux symptoms, please go back to twice daily dosing  Follow up 6 months

## 2022-07-27 LAB — C-REACTIVE PROTEIN: CRP: <1 mg/L (ref 0–10)

## 2022-07-27 LAB — SPECIMEN STATUS REPORT

## 2022-08-01 LAB — CALPROTECTIN, FECAL: Calprotectin, Fecal: 136 ug/g — ABNORMAL HIGH (ref 0–120)

## 2022-08-03 ENCOUNTER — Telehealth (INDEPENDENT_AMBULATORY_CARE_PROVIDER_SITE_OTHER): Payer: Self-pay

## 2022-08-03 NOTE — Telephone Encounter (Signed)
Patient called states he has had diarrhea on going for the last two days. He says she takes Mesalamine 1.2 g TID. He has a history of UC. He had one normal bm yesterday, and then had two "extreme" loose bm's. Today he had two watery diarrhea episodes. He has taken imodium today and it has seemed to help.He denies any mucus, or bright red blood in stools, denies any dark stools, and no fever. He had a fecal calprotectin done 07/27/2022 at 136. He prefers [facility], and if any medications sent he uses the [facility] in [place] on [address]. Please advise.

## 2022-08-04 NOTE — Telephone Encounter (Signed)
Per patient his symptoms are better today and he says he will hold off for now on doing the stools studies. He says if he should have any reoccurring symptoms he will reach back out to the office.

## 2022-08-12 ENCOUNTER — Encounter (INDEPENDENT_AMBULATORY_CARE_PROVIDER_SITE_OTHER): Payer: Self-pay

## 2022-08-12 NOTE — Telephone Encounter (Signed)
I called [employer] and left a detailed message. I asked that they return call to confirm the received the message at the [facility].

## 2022-08-12 NOTE — Telephone Encounter (Signed)
Patient made aware.

## 2022-08-12 NOTE — Telephone Encounter (Signed)
I called [employer] and left a detailed message. I asked that they return call to confirm the received the message at the [facility].

## 2022-08-24 NOTE — Telephone Encounter (Signed)
LMTCB for pt 

## 2022-08-27 ENCOUNTER — Encounter: Payer: Self-pay | Admitting: Internal Medicine

## 2022-08-27 NOTE — Telephone Encounter (Signed)
Best option is dulera 200 Take 2 puffs first thing in am and then another 2 puffs about 12 hours later.     If not covered next best option in advair 115 hfa Take 2 puffs first thing in am and then another 2 puffs about 12 hours later.

## 2022-09-01 MED ORDER — FLUTICASONE-SALMETEROL 115-21 MCG/ACT IN AERO: 2.0000 | INHALATION_SPRAY | Freq: Two times a day (BID) | RESPIRATORY_TRACT | 3 refills | Status: DC

## 2022-09-06 ENCOUNTER — Encounter (HOSPITAL_COMMUNITY)
Admission: RE | Admit: 2022-09-06 | Discharge: 2022-09-06 | Disposition: A | Discharge status: Home / Self Care | Payer: Medicare Other | Source: Ambulatory Visit | Attending: Gastroenterology | Admitting: Gastroenterology

## 2022-09-08 ENCOUNTER — Emergency Department (HOSPITAL_COMMUNITY)
Admission: EM | Admit: 2022-09-08 | Discharge: 2022-09-09 | Disposition: A | Discharge status: Home / Self Care | Payer: Medicare Other | Source: Home / Self Care | Attending: Emergency Medicine | Admitting: Emergency Medicine

## 2022-09-08 DIAGNOSIS — K5730 Diverticulosis of large intestine without perforation or abscess without bleeding: Secondary | ICD-10-CM | POA: Diagnosis not present

## 2022-09-08 DIAGNOSIS — Z8673 Personal history of transient ischemic attack (TIA), and cerebral infarction without residual deficits: Secondary | ICD-10-CM | POA: Diagnosis not present

## 2022-09-08 DIAGNOSIS — K219 Gastro-esophageal reflux disease without esophagitis: Secondary | ICD-10-CM | POA: Diagnosis not present

## 2022-09-08 DIAGNOSIS — Z8711 Personal history of peptic ulcer disease: Secondary | ICD-10-CM | POA: Diagnosis not present

## 2022-09-08 DIAGNOSIS — K625 Hemorrhage of anus and rectum: Secondary | ICD-10-CM | POA: Insufficient documentation

## 2022-09-08 DIAGNOSIS — K51911 Ulcerative colitis, unspecified with rectal bleeding: Secondary | ICD-10-CM | POA: Diagnosis present

## 2022-09-08 DIAGNOSIS — Z8719 Personal history of other diseases of the digestive system: Secondary | ICD-10-CM | POA: Diagnosis not present

## 2022-09-08 DIAGNOSIS — Z87891 Personal history of nicotine dependence: Secondary | ICD-10-CM | POA: Diagnosis not present

## 2022-09-08 DIAGNOSIS — Z980 Intestinal bypass and anastomosis status: Secondary | ICD-10-CM | POA: Diagnosis not present

## 2022-09-08 DIAGNOSIS — J45909 Unspecified asthma, uncomplicated: Secondary | ICD-10-CM | POA: Diagnosis not present

## 2022-09-08 NOTE — ED Triage Notes (Signed)
Pt reports blood from rectum after giving self an enema in preporation for sigmoidoscopy here in the morning r/t ulcerative colitis. Pt says he has had hemorrhoids but has never had a problem with them. Pt says he met a little resistance when inserting tip of enema, with slight pain.

## 2022-09-09 ENCOUNTER — Encounter (HOSPITAL_COMMUNITY): Payer: Self-pay

## 2022-09-09 ENCOUNTER — Ambulatory Visit (HOSPITAL_BASED_OUTPATIENT_CLINIC_OR_DEPARTMENT_OTHER): Payer: Medicare Other | Admitting: Anesthesiology

## 2022-09-09 ENCOUNTER — Ambulatory Visit (HOSPITAL_COMMUNITY): Payer: Medicare Other | Admitting: Anesthesiology

## 2022-09-09 ENCOUNTER — Encounter (HOSPITAL_COMMUNITY)
Admission: RE | Disposition: A | Discharge status: Home / Self Care | Payer: Self-pay | Source: Home / Self Care | Attending: Gastroenterology

## 2022-09-09 ENCOUNTER — Ambulatory Visit (HOSPITAL_COMMUNITY)
Admission: RE | Admit: 2022-09-09 | Discharge: 2022-09-09 | Disposition: A | Discharge status: Home / Self Care | Payer: Medicare Other | Attending: Gastroenterology | Admitting: Gastroenterology

## 2022-09-09 ENCOUNTER — Other Ambulatory Visit: Payer: Self-pay

## 2022-09-09 ENCOUNTER — Encounter (HOSPITAL_COMMUNITY): Payer: Self-pay | Admitting: Gastroenterology

## 2022-09-09 DIAGNOSIS — Z980 Intestinal bypass and anastomosis status: Secondary | ICD-10-CM

## 2022-09-09 DIAGNOSIS — Z8711 Personal history of peptic ulcer disease: Secondary | ICD-10-CM | POA: Insufficient documentation

## 2022-09-09 DIAGNOSIS — R195 Other fecal abnormalities: Secondary | ICD-10-CM | POA: Diagnosis not present

## 2022-09-09 DIAGNOSIS — K626 Ulcer of anus and rectum: Secondary | ICD-10-CM | POA: Diagnosis not present

## 2022-09-09 DIAGNOSIS — Z8719 Personal history of other diseases of the digestive system: Secondary | ICD-10-CM | POA: Diagnosis not present

## 2022-09-09 DIAGNOSIS — K51911 Ulcerative colitis, unspecified with rectal bleeding: Secondary | ICD-10-CM | POA: Insufficient documentation

## 2022-09-09 DIAGNOSIS — Z8673 Personal history of transient ischemic attack (TIA), and cerebral infarction without residual deficits: Secondary | ICD-10-CM | POA: Insufficient documentation

## 2022-09-09 DIAGNOSIS — Z87891 Personal history of nicotine dependence: Secondary | ICD-10-CM | POA: Insufficient documentation

## 2022-09-09 DIAGNOSIS — J45909 Unspecified asthma, uncomplicated: Secondary | ICD-10-CM | POA: Diagnosis not present

## 2022-09-09 DIAGNOSIS — K5730 Diverticulosis of large intestine without perforation or abscess without bleeding: Secondary | ICD-10-CM | POA: Insufficient documentation

## 2022-09-09 DIAGNOSIS — K5190 Ulcerative colitis, unspecified, without complications: Secondary | ICD-10-CM

## 2022-09-09 DIAGNOSIS — K51319 Ulcerative (chronic) rectosigmoiditis with unspecified complications: Secondary | ICD-10-CM

## 2022-09-09 DIAGNOSIS — K219 Gastro-esophageal reflux disease without esophagitis: Secondary | ICD-10-CM | POA: Insufficient documentation

## 2022-09-09 HISTORY — PX: FLEXIBLE SIGMOIDOSCOPY: SHX5431

## 2022-09-09 SURGERY — SIGMOIDOSCOPY, FLEXIBLE
Anesthesia: General

## 2022-09-09 MED ORDER — LACTATED RINGERS IV SOLN: INTRAVENOUS | Status: DC

## 2022-09-09 MED ORDER — PROPOFOL 10 MG/ML IV BOLUS
INTRAVENOUS | Status: DC | PRN
  Administered 2022-09-09 (×3): 20 mg via INTRAVENOUS
  Administered 2022-09-09: 50 mg via INTRAVENOUS

## 2022-09-09 NOTE — Anesthesia Preprocedure Evaluation (Signed)
Anesthesia Evaluation  Patient identified by MRN, date of birth, ID band Patient awake    Reviewed: Allergy & Precautions, H&P , NPO status , Patient's Chart, lab work & pertinent test results  Airway Mallampati: II  TM Distance: >3 FB Neck ROM: Full    Dental  (+) Dental Advisory Given, Missing   Pulmonary asthma , former smoker   Pulmonary exam normal breath sounds clear to auscultation       Cardiovascular negative cardio ROS Normal cardiovascular exam Rhythm:Regular Rate:Normal     Neuro/Psych CVA  negative psych ROS   GI/Hepatic PUD,GERD (barrett's esophagus without dysplasia)  ,,Gilbert's syndrome Ulcerative colitis    Endo/Other  negative endocrine ROS    Renal/GU Renal disease  negative genitourinary   Musculoskeletal negative musculoskeletal ROS (+)    Abdominal   Peds negative pediatric ROS (+)  Hematology negative hematology ROS (+)   Anesthesia Other Findings Gilbert's syndrome  Reproductive/Obstetrics negative OB ROS                             Anesthesia Physical Anesthesia Plan  ASA: 3  Anesthesia Plan: General   Post-op Pain Management: Minimal or no pain anticipated   Induction: Intravenous  PONV Risk Score and Plan: Propofol infusion  Airway Management Planned: Natural Airway and Nasal Cannula  Additional Equipment:   Intra-op Plan:   Post-operative Plan:   Informed Consent: I have reviewed the patients History and Physical, chart, labs and discussed the procedure including the risks, benefits and alternatives for the proposed anesthesia with the patient or authorized representative who has indicated his/her understanding and acceptance.     Dental advisory given  Plan Discussed with: CRNA and Surgeon  Anesthesia Plan Comments:        Anesthesia Quick Evaluation

## 2022-09-09 NOTE — H&P (Signed)
Orrie Schubert is an 77 y.o. male.   Chief Complaint: Follow-up ulcerative colitis HPI: Merced Hanners is a 77 y.o. 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  Patient reports that he has presented an episode of diarrhea every 10 days.  Yesterday he had some rectal bleeding when having the enemas but he does not have this on a regular basis.  Reported he was found to have a colovesical fistula in the past.  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   Enlarged prostate    Gilbert's syndrome    Goiter    Kidney cysts    Kidney stones 2012, 2016   Perforation of bladder 04/04/2017   Stroke ([place]) 08/16/2004   Ulcerative colitis ([place]) 2017    Past Surgical History:  Procedure Laterality Date   CHOLECYSTECTOMY     colon fistula     DRUG INDUCED ENDOSCOPY  11/2021   HERNIA REPAIR Bilateral 1992   HERNIA REPAIR  03/2013   hernia, left side along with sperm cord lesion - Dr. Herschell Dimes, [place]   NASAL SINUS SURGERY Right 02/17/2022   growth right nasal passage   PROSTATE BIOPSY  2012   sigmodscopy  11/2021    Family History  Problem Relation Age of Onset   Cancer - Colon Mother    Pancreatic cancer Mother    Social History:  reports that he quit smoking about 18 years ago. His smoking use included cigarettes. He has a 35.00 pack-year smoking history. He has been exposed to tobacco smoke. He has never used smokeless tobacco. He reports that he does not drink alcohol and does not use drugs.  Allergies:  Allergies  Allergen Reactions   Sulfasalazine     Flu like symptoms, nausea, vomiting    Medications Prior to Admission  Medication Sig Dispense Refill   albuterol (VENTOLIN HFA) 108 (90 Base) MCG/ACT  inhaler Inhale 2 puffs into the lungs every 6 (six) hours as needed for wheezing or shortness of breath. 8 g 5   Azelastine HCl 137 MCG/SPRAY SOLN Place 1 spray into both nostrils 2 (two) times daily.     dicyclomine (BENTYL) 10 MG capsule Take 1 capsule (10 mg total) by mouth 2 (two) times daily as needed for spasms. 60 capsule 1   fluticasone-salmeterol (ADVAIR HFA) 115-21 MCG/ACT inhaler Inhale 2 puffs into the lungs 2 (two) times daily. 12 g 3   folic acid (FOLVITE) 1 MG tablet Take 1 mg by mouth 2 (two) times daily.     latanoprost (XALATAN) 0.005 % ophthalmic solution Place 1 drop into both eyes at bedtime.     mesalamine (LIALDA) 1.2 g EC tablet Take 4.8 g by mouth daily with breakfast.     montelukast (SINGULAIR) 10 MG tablet TAKE 1 TABLET(10 MG) BY MOUTH AT BEDTIME 90 tablet 3   omeprazole (PRILOSEC) 20 MG capsule Take 20 mg by mouth 2 (two) times daily before a meal.     Probiotic CAPS Take 1 capsule by mouth daily.     rOPINIRole (REQUIP) 0.25 MG tablet Take 0.5 mg by mouth 3 (three) times daily.     tamsulosin (FLOMAX) 0.4 MG CAPS capsule Take 0.4 mg by mouth 2 (two) times daily before lunch and supper.  budesonide-formoterol (SYMBICORT) 160-4.5 MCG/ACT inhaler Take 2 puffs first thing in am and then another 2 puffs about 12 hours later. (Patient not taking: Reported on 09/01/2022) 1 each 11   PROAIR HFA 108 (90 Base) MCG/ACT inhaler Inhale 1-2 puffs into the lungs every 4 (four) hours as needed for wheezing or shortness of breath. (Patient not taking: Reported on 09/01/2022) 1 g 1    No results found for this or any previous visit (from the past 48 hour(s)). No results found.  Review of Systems  Gastrointestinal:  Positive for diarrhea and rectal pain.  All other systems reviewed and are negative.   Blood pressure (!) 156/70, pulse 62, temperature 98.2 F (36.8 C), temperature source Oral, resp. rate 16, height 5\' 11"  (1.803 m), weight 66.2 kg, SpO2 96 %. Physical Exam   GENERAL: The patient is AO x3, in no acute distress. HEENT: Head is normocephalic and atraumatic. EOMI are intact. Mouth is well hydrated and without lesions. NECK: Supple. No masses LUNGS: Clear to auscultation. No presence of rhonchi/wheezing/rales. Adequate chest expansion HEART: RRR, normal s1 and s2. ABDOMEN: Soft, nontender, no guarding, no peritoneal signs, and nondistended. BS +. No masses. EXTREMITIES: Without any cyanosis, clubbing, rash, lesions or edema. NEUROLOGIC: AOx3, no focal motor deficit. SKIN: no jaundice, no rashes  Assessment/Plan Terrion Gencarelli is a 77 y.o. 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  Will proceed with flexible sigmoidoscopy.  Harvel Quale, MD 09/09/2022, 11:39 AM

## 2022-09-09 NOTE — Transfer of Care (Signed)
Immediate Anesthesia Transfer of Care Note  Patient: Juan House  Procedure(s) Performed: FLEXIBLE SIGMOIDOSCOPY  Patient Location: Short Stay  Anesthesia Type:General  Level of Consciousness: awake  Airway & Oxygen Therapy: Patient Spontanous Breathing  Post-op Assessment: Report given to RN  Post vital signs: Reviewed and stable  Last Vitals:  Vitals Value Taken Time  BP 105/72 09/09/22 1301  Temp 36.7 C 09/09/22 1301  Pulse 64 09/09/22 1301  Resp 15 09/09/22 1301  SpO2 97 % 09/09/22 1301    Last Pain:  Vitals:   09/09/22 1301  TempSrc: Oral  PainSc: 0-No pain         Complications: No notable events documented.

## 2022-09-09 NOTE — Discharge Instructions (Signed)
You are being discharged to home.  Resume your previous diet.  Continue your present medications.  Your physician has recommended a repeat colonoscopy in two years for surveillance.

## 2022-09-09 NOTE — ED Provider Notes (Signed)
[facility]  Provider Note  CSN: [id number] Arrival date & time: 09/08/22 2315  History Chief Complaint  Patient presents with   Rectal Bleeding    Juan House is a 77 y.o. male with history of ulcerative colitis, complicated by prior colo-vesicular fistula requiring partial colectomy in 2017 recently transferred his care from GI in [place] to here in [place] and is scheduled for a surveillance sigmoidoscopy tomorrow morning. He was doing an enema as part of his prescribed bowel prep tonight when he noted bright red blood from rectum. He denies any pain but does reports some resistance with inserting the enema nozzle. He has not had any other recent symptoms of UC flare. No fever, abdominal pain or diarrhea.    Home Medications Prior to Admission medications   Medication Sig Start Date End Date Taking? Authorizing Provider  albuterol (VENTOLIN HFA) 108 (90 Base) MCG/ACT inhaler Inhale 2 puffs into the lungs every 6 (six) hours as needed for wheezing or shortness of breath. 05/18/22   Tanda Rockers, MD  Azelastine HCl 137 MCG/SPRAY SOLN Place 1 spray into both nostrils 2 (two) times daily. 01/06/22   [provider]  budesonide-formoterol (SYMBICORT) 160-4.5 MCG/ACT inhaler Take 2 puffs first thing in am and then another 2 puffs about 12 hours later. Patient not taking: Reported on 09/01/2022 04/28/22   Tanda Rockers, MD  dicyclomine (BENTYL) 10 MG capsule Take 1 capsule (10 mg total) by mouth 2 (two) times daily as needed for spasms. 07/26/22   Carlan, Deatra Robinson, NP  fluticasone-salmeterol (ADVAIR HFA) 115-21 MCG/ACT inhaler Inhale 2 puffs into the lungs 2 (two) times daily. 09/01/22   Tanda Rockers, MD  folic acid (FOLVITE) 1 MG tablet Take 1 mg by mouth 2 (two) times daily. 10/28/16   [provider]  latanoprost (XALATAN) 0.005 % ophthalmic solution Place 1 drop into both eyes at bedtime. 03/04/22   [provider]  mesalamine (LIALDA) 1.2 g EC tablet Take 4.8 g by mouth daily with breakfast. 12/22/17   [provider]  montelukast (SINGULAIR) 10 MG tablet TAKE 1 TABLET(10 MG) BY MOUTH AT BEDTIME 04/28/22   Tanda Rockers, MD  omeprazole (PRILOSEC) 20 MG capsule Take 20 mg by mouth 2 (two) times daily before a meal.    [provider]  PROAIR HFA 108 (90 Base) MCG/ACT inhaler Inhale 1-2 puffs into the lungs every 4 (four) hours as needed for wheezing or shortness of breath. Patient not taking: Reported on 09/01/2022 04/28/22   Tanda Rockers, MD  Probiotic CAPS Take 1 capsule by mouth daily.    [provider]  rOPINIRole (REQUIP) 0.25 MG tablet Take 0.5 mg by mouth 3 (three) times daily.    [provider]  tamsulosin (FLOMAX) 0.4 MG CAPS capsule Take 0.4 mg by mouth 2 (two) times daily before lunch and supper. 10/06/15   [provider]     Allergies    Sulfasalazine   Review of Systems   Review of Systems Please see HPI for pertinent positives and negatives  Physical Exam BP (!) 148/77 (BP Location: Right Arm)   Pulse 66   Temp 97.9 F (36.6 C) (Oral)   Resp 17   Ht 5\' 11"  (1.803 m)   Wt 66.2 kg   SpO2 96%   BMI 20.36 kg/m   Physical Exam Vitals and nursing note reviewed.  Constitutional:      Appearance: Normal appearance.  HENT:     Head: Normocephalic and atraumatic.     Nose: Nose normal.     Mouth/Throat:     Mouth: Mucous membranes are moist.  Eyes:     Extraocular Movements: Extraocular movements intact.     Conjunctiva/sclera: Conjunctivae normal.  Cardiovascular:     Rate and Rhythm: Normal rate.  Pulmonary:     Effort: Pulmonary effort is normal.     Breath sounds: Normal breath sounds.  Abdominal:     General: Abdomen is flat.     Palpations: Abdomen is soft.     Tenderness: There is no abdominal tenderness.  Genitourinary:    Comments: Examination chaperoned. Non thrombosed, non bleeding hemorrhoid. No fissure, No  tenderness or mass with DRE. Prostate is enlarged. No blood on fingertip.  Musculoskeletal:        General: No swelling. Normal range of motion.     Cervical back: Neck supple.  Skin:    General: Skin is warm and dry.  Neurological:     General: No focal deficit present.     Mental Status: He is alert.  Psychiatric:        Mood and Affect: Mood normal.     ED Results / Procedures / Treatments   EKG None  Procedures Procedures  Medications Ordered in the ED Medications - No data to display  Initial Impression and Plan  Patient here with [facility] after using an enema in prep for sigmoidoscopy scheduled for tomorrow morning. Rectal exam is benign. Will discuss with on call GI as patient reports he is supposed to do two additional enemas before the procedure.   ED Course   Clinical Course as of 09/09/22 0042  Thu Sep 09, 2022  0030 Spoke with Dr. Gala Romney, GI, who advises not to do additional enemas tonight but to bring them with him in the AM and they can decide then if he needs more prep. He is not currently bleeding, not on anticoagulation and hemodynamically stable. Recommend he monitor for further bleeding overnight and RTED if he has worsening bleeding or any symptoms of acute anemia.  [CS]    Clinical Course User Index [CS] Truddie Hidden, MD     MDM Rules/Calculators/A&P Medical Decision Making Problems Addressed: Rectal bleeding: acute illness or injury  Risk Decision regarding hospitalization.     Final Clinical Impression(s) / ED Diagnoses Final diagnoses:  Rectal bleeding    Rx / DC Orders ED Discharge Orders     None        Truddie Hidden, MD 09/09/22 [phone]

## 2022-09-09 NOTE — Op Note (Signed)
[facility] Patient Name: Juan House Procedure Date: 09/09/2022 12:22 PM MRN: [id number] Date of Birth: Nov 08, 1945 Attending MD: Maylon Peppers , , [id number] CSN: [id number] Age: 77 Admit Type: Outpatient Procedure:                Flexible Sigmoidoscopy Indications:              Personal history of inflammatory bowel disease Providers:                Maylon Peppers, Crystal Page, Aram Candela Referring MD:              Medicines:                Monitored Anesthesia Care Complications:            No immediate complications. Estimated Blood Loss:     Estimated blood loss: none. Procedure:                Pre-Anesthesia Assessment:                           - Prior to the procedure, a History and Physical                            was performed, and patient medications, allergies                            and sensitivities were reviewed. The patient's                            tolerance of previous anesthesia was reviewed.                           - The risks and benefits of the procedure and the                            sedation options and risks were discussed with the                            patient. All questions were answered and informed                            consent was obtained.                           - ASA Grade Assessment: II - A patient with mild                            systemic disease.                           After obtaining informed consent, the scope was                            passed under direct vision. The PCF-HQ190L                            ([id number]) scope was  introduced through the anus and                            advanced to the 40 cm from the anal verge. The                            flexible sigmoidoscopy was accomplished without                            difficulty. The patient tolerated the procedure                            well. The quality of the bowel preparation was poor. Scope In: 12:50:07 PM Scope Out:  12:55:38 PM Total Procedure Duration: 0 hours 5 minutes 31 seconds  Findings:      The perianal and digital rectal examinations were normal.      Multiple large-mouthed and small-mouthed diverticula were found in the       sigmoid colon and descending colon.      There was evidence of a prior end-to-side colo-colonic anastomosis in       the sigmoid colon. This was patent and was characterized by healthy       appearing mucosa. The anastomosis was traversed.      A moderate amount of stool was found in the rectum, in the sigmoid colon       and in the descending colon, making visualization difficult.      A single localized non-bleeding erosion was found in the rectum.       Stigmata of recent bleeding were present. This likely corresponded to       trauma from enema catheter      Normal rectal retroflexion.      NOTE: No inflammation found at the moment, Mayo 0. Impression:               - Preparation of the colon was poor.                           - Diverticulosis in the sigmoid colon and in the                            descending colon.                           - Patent end-to-side colo-colonic anastomosis,                            characterized by healthy appearing mucosa.                           - Stool in the rectum, in the sigmoid colon and in                            the descending colon.                           - A single erosion in the rectum, likely secondary  to trauma.                           - No specimens collected. Moderate Sedation:      Per Anesthesia Care Recommendation:           - Discharge patient to home (ambulatory).                           - Resume previous diet.                           - Continue present medications.                           - Repeat colonoscopy in 2 years for surveillance. Procedure Code(s):        --- Professional ---                           [phone], Sigmoidoscopy, flexible; diagnostic,                             including collection of specimen(s) by brushing or                            washing, when performed (separate procedure) Diagnosis Code(s):        --- Professional ---                           Z98.0, Intestinal bypass and anastomosis status                           K62.6, Ulcer of anus and rectum                           Z87.19, Personal history of other diseases of the                            digestive system                           K57.30, Diverticulosis of large intestine without                            perforation or abscess without bleeding CPT copyright 2022 American Medical Association. All rights reserved. The codes documented in this report are preliminary and upon coder review may  be revised to meet current compliance requirements. Juan Blazing, MD Juan House,  09/09/2022 1:07:17 PM This report has been signed electronically. Number of Addenda: 0

## 2022-09-09 NOTE — Anesthesia Postprocedure Evaluation (Signed)
Anesthesia Post Note  Patient: Juan House  Procedure(s) Performed: [place]  Patient location during evaluation: Short Stay Anesthesia Type: General Level of consciousness: awake and alert Pain management: pain level controlled Vital Signs Assessment: post-procedure vital signs reviewed and stable Respiratory status: spontaneous breathing Cardiovascular status: blood pressure returned to baseline and stable Postop Assessment: no apparent nausea or vomiting Anesthetic complications: no   No notable events documented.   Last Vitals:  Vitals:   09/09/22 1057 09/09/22 1301  BP: (!) 156/70 105/72  Pulse: 62 64  Resp: 16 15  Temp: 36.8 C 36.7 C  SpO2: 96% 97%    Last Pain:  Vitals:   09/09/22 1301  TempSrc: Oral  PainSc: 0-No pain                 ,

## 2022-09-09 NOTE — Discharge Instructions (Signed)
Return in the morning for your scheduled sigmoidoscopy. Do not use any more enemas during the night but bring them with you in the morning and be sure to discuss your bleeding with Dr. Jenetta Downer. If you have any worsening or uncontrolled bleeding during the night, please returned.

## 2022-09-10 ENCOUNTER — Telehealth (INDEPENDENT_AMBULATORY_CARE_PROVIDER_SITE_OTHER): Payer: Self-pay

## 2022-09-10 NOTE — Telephone Encounter (Signed)
Patient states he had a sigmoidoscopy done on 09/09/2022 and he has had three loose Bm's today and wanted to know if he could take an imodium. I advise that it was probably some residual effects of the enema he had used prior to the sigmoidoscopy, and if he felt he needed to I did not think that would be an issue. Patient state he thinks he will wait to take it only if diarrhea worsens. I advised that If he should continue to have issues to please call the office. Patient states understanding.

## 2022-09-10 NOTE — Telephone Encounter (Signed)
Thanks Crystal, agree with this.

## 2022-09-14 ENCOUNTER — Encounter (HOSPITAL_COMMUNITY): Payer: Self-pay | Admitting: Gastroenterology

## 2022-10-19 DIAGNOSIS — M545 Low back pain, unspecified: Secondary | ICD-10-CM | POA: Insufficient documentation

## 2022-10-25 ENCOUNTER — Encounter (INDEPENDENT_AMBULATORY_CARE_PROVIDER_SITE_OTHER): Payer: Self-pay

## 2022-10-26 ENCOUNTER — Other Ambulatory Visit (INDEPENDENT_AMBULATORY_CARE_PROVIDER_SITE_OTHER): Payer: Self-pay | Admitting: Gastroenterology

## 2022-10-28 ENCOUNTER — Other Ambulatory Visit (INDEPENDENT_AMBULATORY_CARE_PROVIDER_SITE_OTHER): Payer: Self-pay | Admitting: Gastroenterology

## 2022-10-28 MED ORDER — DICYCLOMINE HCL 10 MG PO CAPS: 10.0000 mg | ORAL_CAPSULE | Freq: Two times a day (BID) | ORAL | 2 refills | Status: DC | PRN

## 2022-10-28 NOTE — Telephone Encounter (Signed)
Crystal spoke with patient and let him know med was sent in.

## 2022-11-22 ENCOUNTER — Encounter (INDEPENDENT_AMBULATORY_CARE_PROVIDER_SITE_OTHER): Payer: Self-pay

## 2023-01-09 ENCOUNTER — Encounter: Payer: Self-pay | Admitting: Internal Medicine

## 2023-01-09 ENCOUNTER — Other Ambulatory Visit: Payer: Self-pay | Admitting: Internal Medicine

## 2023-01-13 ENCOUNTER — Other Ambulatory Visit: Payer: Self-pay | Admitting: Internal Medicine

## 2023-01-17 ENCOUNTER — Other Ambulatory Visit: Payer: Self-pay

## 2023-01-19 MED ORDER — ADVAIR HFA 115-21 MCG/ACT IN AERO: 2.0000 | INHALATION_SPRAY | Freq: Two times a day (BID) | RESPIRATORY_TRACT | 4 refills | Status: DC

## 2023-01-27 ENCOUNTER — Ambulatory Visit (INDEPENDENT_AMBULATORY_CARE_PROVIDER_SITE_OTHER): Payer: Medicare Other | Admitting: Gastroenterology

## 2023-01-27 ENCOUNTER — Encounter (INDEPENDENT_AMBULATORY_CARE_PROVIDER_SITE_OTHER): Payer: Self-pay | Admitting: Gastroenterology

## 2023-01-27 VITALS — BP 119/62 | HR 62 | Temp 97.5°F | Ht 71.0 in | Wt 144.9 lb

## 2023-01-27 DIAGNOSIS — K51919 Ulcerative colitis, unspecified with unspecified complications: Secondary | ICD-10-CM

## 2023-01-27 NOTE — Patient Instructions (Signed)
Continue with lialda 4.8g daily, we can fill out our portion of the patient assistance forms once you get these to [place] I will check inflammatory markers today I will obtain a copy of recent labs from your PCP to have on file here   Follow up 6 months

## 2023-01-27 NOTE — Progress Notes (Addendum)
Referring Provider: Alinda Deem, MD Primary Care Physician:  Alinda Deem, MD Primary GI Physician: Levon Hedger   Chief Complaint  Patient presents with   Follow-up    Patient here today for a follow up on UC. Patient denies any current issues as of today. Patient takes Lialda 1.2 g tablets 4.8 g po daily. He says his gerd is controlled and on omeprazole 20 mg once per day.   HPI:   Juan House is a 77 y.o. male with past medical history of Ulcerative rectosigmoiditis and Barrett's esophagus   Patient presenting today for follow up of UC  Last seen December 2023, at that time having BMs anywhere from 0-3 per day. Sometimes can go a day or two without a BM. Stools are mostly formed. loose stool maybe once per month which he takes imodium for with good results. He can still have some fecal urgency.. No abdominal pain. Appetite is good. He is taking a daily probiotic and eating yogurt daily as well. Maintained on omeprazole 20mg  BID for his GERD/BE with good results  Recommended to continue lialda 1.2g TID, Low fodmap diet, dicyclomine BID PRN, crp, fecal cal, omeprazole 20mg  daily.  Fecal cal elevated to 136, advised to go up to 4.8g Lialda, schedule flex sig   Present: Patient states that he is doing well. He questions why he has to have stool testing prior to getting prednisone for his flare ups. He is having a BM 0-3x/day. He feels that he has occasional flares and will have some constipation thereafter which he will have to take a stool softener. He notes that he continues to have fecal urgency, in the mornings after breakfast, usually once he gets through this he does not have further urgency. His PCP has provided him with some budesonide a while back for a suspected flare. He notes that he watches his diet closely, no red meats or fried foods. He did have some weight loss recently of about 9 pounds when he went to [place] as he avoided eating due to concerns for fecal urgency  while traveling.  He has required steroid course twice over the past 3 months (PCP provided this). He is taking imodium PRN. Takes dicyclomine on occasion, though does not notice much improvement with it. Stools are rarely watery, usually 50/50 between loose and solid.  No abdominal pain, rectal bleeding, mucus or melena. Overall he is feeling good from a GI standpoint.   He notes that when he did the enema for his flex sig he had rectal bleeding and had to be seen in the ER.  Had recent labs done in March that were normal-per the patient. No inflammatory markers done at that time though.  GERD is well managed on omeprazole 20mg  daily. No breakthrough. No dysphagia or odynophagia.   FLex sig: 08/2022 - Preparation of the colon was poor.                           - Diverticulosis in the sigmoid colon and in the                            descending colon.                           - Patent end-to-side colo-colonic anastomosis,  characterized by healthy appearing mucosa.                           - Stool in the rectum, in the sigmoid colon and in                            the descending colon.                           - A single erosion in the rectum, likely secondary                            to trauma.                           - No specimens collected. Last Endoscopy: April 2023, normal duodenum, normal mucosa in esophagus, erythema and edema, marked inflammation in antrum compatible with non erosive gastritis. ( Stomach bx showed reactive foveolar hyperplasia, stromal fibrosis and focal chronic inflammation, no h pylori, esophagus bx showed chronic inflammation, negative for barrett's or dysplasia, squamous mucosa with reactive epithelial changes)  Last Colonoscopy:August 2021, hyperplastic polyp rectum, adenomatous polyp hepatic flexure, normal biopsies   Recommendations:  Repeat colon 2 years   Past Medical History:  Diagnosis Date   Asthma    Barrett  esophagus    Colonic fistula 04/04/2017   Enlarged prostate    Gilbert's syndrome    Goiter    Kidney cysts    Kidney stones 2012, 2016   Perforation of bladder 04/04/2017   Stroke (HCC) 08/16/2004   Ulcerative colitis (HCC) 2017    Past Surgical History:  Procedure Laterality Date   CHOLECYSTECTOMY     colon fistula     DRUG INDUCED ENDOSCOPY  11/2021   FLEXIBLE SIGMOIDOSCOPY N/A 09/09/2022   Procedure: FLEXIBLE SIGMOIDOSCOPY;  Surgeon: Dolores Frame, MD;  Location: [facility];  Service: Gastroenterology;  Laterality: N/A;  100pm, asa 3   HERNIA REPAIR Bilateral 1992   HERNIA REPAIR  03/2013   hernia, left side along with sperm cord lesion - Dr. Tilda Franco, [place]   NASAL SINUS SURGERY Right 02/17/2022   growth right nasal passage   PROSTATE BIOPSY  2012   sigmodscopy  11/2021    Current Outpatient Medications  Medication Sig Dispense Refill   ADVAIR HFA 115-21 MCG/ACT inhaler Inhale 2 puffs into the lungs 2 (two) times daily. 12 g 4   albuterol (VENTOLIN HFA) 108 (90 Base) MCG/ACT inhaler Inhale 2 puffs into the lungs every 6 (six) hours as needed for wheezing or shortness of breath. 8 g 5   Azelastine HCl 137 MCG/SPRAY SOLN Place 1 spray into both nostrils 2 (two) times daily.     dicyclomine (BENTYL) 10 MG capsule Take 1 capsule (10 mg total) by mouth 2 (two) times daily as needed for spasms. 60 capsule 2   folic acid (FOLVITE) 1 MG tablet Take 1 mg by mouth 2 (two) times daily.     latanoprost (XALATAN) 0.005 % ophthalmic solution Place 1 drop into both eyes at bedtime.     mesalamine (LIALDA) 1.2 g EC tablet Take 4.8 g by mouth daily with breakfast.     montelukast (SINGULAIR) 10 MG tablet TAKE 1 TABLET(10 MG) BY MOUTH AT BEDTIME 90 tablet 3   omeprazole (  PRILOSEC) 20 MG capsule Take 20 mg by mouth 2 (two) times daily before a meal.     Probiotic CAPS Take 1 capsule by mouth daily.     rOPINIRole (REQUIP) 0.25 MG tablet Take 0.5 mg by mouth 3 (three) times  daily.     tamsulosin (FLOMAX) 0.4 MG CAPS capsule Take 0.4 mg by mouth 2 (two) times daily before lunch and supper.     No current facility-administered medications for this visit.    Allergies as of 01/27/2023 - Review Complete 01/27/2023  Allergen Reaction Noted   Sulfasalazine  07/26/2022    Family History  Problem Relation Age of Onset   Cancer - Colon Mother    Pancreatic cancer Mother     Social History   Socioeconomic History   Marital status: Married    Spouse name: Not on file   Number of children: Not on file   Years of education: Not on file   Highest education level: Not on file  Occupational History   Not on file  Tobacco Use   Smoking status: Former    Packs/day: 1.00    Years: 35.00    Additional pack years: 0.00    Total pack years: 35.00    Types: Cigarettes    Quit date: 08/16/2004    Years since quitting: 18.4    Passive exposure: Past   Smokeless tobacco: Never  Vaping Use   Vaping Use: Never used  Substance and Sexual Activity   Alcohol use: Never   Drug use: Never   Sexual activity: Not on file  Other Topics Concern   Not on file  Social History Narrative   Not on file   Social Determinants of Health   Financial Resource Strain: Not on file  Food Insecurity: Not on file  Transportation Needs: Not on file  Physical Activity: Not on file  Stress: Not on file  Social Connections: Not on file    Review of systems General: negative for malaise, night sweats, fever, chills, weight loss Neck: Negative for lumps, goiter, pain and significant neck swelling Resp: Negative for cough, wheezing, dyspnea at rest CV: Negative for chest pain, leg swelling, palpitations, orthopnea GI: denies melena, hematochezia, nausea, vomiting, diarrhea, constipation, dysphagia, odyonophagia, early satiety or unintentional weight loss. +fecal urgency  MSK: Negative for joint pain or swelling, back pain, and muscle pain. Derm: Negative for itching or  rash Psych: Denies depression, anxiety, memory loss, confusion. No homicidal or suicidal ideation.  Heme: Negative for prolonged bleeding, bruising easily, and swollen nodes. Endocrine: Negative for cold or heat intolerance, polyuria, polydipsia and goiter. Neuro: negative for tremor, gait imbalance, syncope and seizures. The remainder of the review of systems is noncontributory.  Physical Exam: BP 119/62 (BP Location: Left Arm, Patient Position: Sitting, Cuff Size: Normal)   Pulse 62   Temp (!) 97.5 F (36.4 C) (Temporal)   Ht 5\' 11"  (1.803 m)   Wt 144 lb 14.4 oz (65.7 kg)   BMI 20.21 kg/m  General:   Alert and oriented. No distress noted. Pleasant and cooperative.  Head:  Normocephalic and atraumatic. Eyes:  Conjuctiva clear without scleral icterus. Mouth:  Oral mucosa pink and moist. Good dentition. No lesions. Heart: Normal rate and rhythm, s1 and s2 heart sounds present.  Lungs: Clear lung sounds in all lobes. Respirations equal and unlabored. Abdomen:  +BS, soft, non-tender and non-distended. No rebound or guarding. No HSM or masses noted. Derm: No palmar erythema or jaundice Msk:  Symmetrical without  gross deformities. Normal posture. Extremities:  Without edema. Neurologic:  Alert and  oriented x4 Psych:  Alert and cooperative. Normal mood and affect.  Invalid input(s): "6 MONTHS"   ASSESSMENT: Juan House is a 77 y.o. male presenting today for follow up of Ulcerative Colitis.  UC: Maintained on Lialda, dosage was increased to 4.8g daily after Fecal calprotectin in December was elevated to 4.8, CRP at that time was normal. Flex sig in January with poor prep, Patent end-to-side colo-colonic anastomosis, characterized by healthy appearing mucosa. He is feeling well. Having 0-3 BMs per day with some urgency usually after breakfast, dicyclomine does not tend to provide much results. He has rare watery stools, no rectal bleeding or abdominal pain. He reports possibly 2 steroid  courses within the past 3 months for suspected flare. His PCP provided budesonide to him as he had reached out to [place] previously and did not want to wait to complete stool studies prior to steroids being sent. I discussed with him the importance of ruling out infectious etiologies of diarrhea prior to providing steroids for a suspected flare as this could exacerbate an infection, to which he verbalized understanding of. For now we will continue with Lialda 4.8g daily, check CRP, fecal calprotectin and obtain labs from PCP. Pending inflammatory markers and clinical course with more flares, may need to consider discussing alternative therapies for his UC.     PLAN:  Continue with Lialda 4.8g daily  2. CRP, fecal calprotectin  3. Tentative Repeat colonoscopy 2 years   All questions were answered, patient verbalized understanding and is in agreement with plan as outlined above.   Follow Up: 6 months    L. Jeanmarie Hubert, MSN, APRN, AGNP-C Adult-Gerontology Nurse Practitioner [facility]  I have reviewed the note and agree with the APP's assessment as described in this progress note  Katrinka Blazing, MD Gastroenterology and Hepatology [facility] Gastroenterology

## 2023-01-28 LAB — C-REACTIVE PROTEIN: CRP: 15 mg/L — ABNORMAL HIGH (ref 0–10)

## 2023-01-31 ENCOUNTER — Other Ambulatory Visit (INDEPENDENT_AMBULATORY_CARE_PROVIDER_SITE_OTHER): Payer: Self-pay | Admitting: Gastroenterology

## 2023-02-05 LAB — CALPROTECTIN, FECAL: Calprotectin, Fecal: 169 ug/g — ABNORMAL HIGH (ref 0–120)

## 2023-02-07 ENCOUNTER — Encounter (INDEPENDENT_AMBULATORY_CARE_PROVIDER_SITE_OTHER): Payer: Self-pay

## 2023-02-09 NOTE — Telephone Encounter (Signed)
Patient called back today to follow up on this message. Please advise.

## 2023-02-10 ENCOUNTER — Encounter (INDEPENDENT_AMBULATORY_CARE_PROVIDER_SITE_OTHER): Payer: Self-pay

## 2023-02-10 NOTE — Telephone Encounter (Signed)
Patient has called this afternoon to check on this message. I have sent a My Chart message letting him know we have the message from this am and we have had clinic all morning. I advised that we would be in contact with him either through My Chart or phone with a response.

## 2023-02-11 ENCOUNTER — Other Ambulatory Visit (INDEPENDENT_AMBULATORY_CARE_PROVIDER_SITE_OTHER): Payer: Self-pay

## 2023-02-11 ENCOUNTER — Encounter (INDEPENDENT_AMBULATORY_CARE_PROVIDER_SITE_OTHER): Payer: Self-pay

## 2023-02-14 ENCOUNTER — Telehealth (INDEPENDENT_AMBULATORY_CARE_PROVIDER_SITE_OTHER): Payer: Self-pay

## 2023-02-14 NOTE — Telephone Encounter (Signed)
Patient to bring me the income and sign forms for [employer] on Wednesday 02/16/2023.

## 2023-02-15 NOTE — Telephone Encounter (Signed)
Patient came by the office today and signed forms and brought income with her. I have faxed the forms to [employer] for [employer].

## 2023-02-22 NOTE — Telephone Encounter (Signed)
Patient called and says he will have an EKG done on 02/23/2023 by [facility] which is the patient's PCP. Once done I have asked that they read it and make a notation if normal or not and fax that plus the EKG report to our office.

## 2023-02-22 NOTE — Telephone Encounter (Signed)
Patient aware we will need to get EKG from Cardiologist or from pcp before starting Velsipity. Once we get the EKG we will then need to mark the application that the baseline assessment has been completed and fax it back to the patient assistance foundation.

## 2023-02-22 NOTE — Telephone Encounter (Signed)
Per patient he is now considered a new patient at Cardiology as he was last seen there in 2021. He says the next available appointment will be Oct or Nov 2024 before he can be seen. I called and left a message that he will need to reach out to pcp to have done and it will need to be read and that report will need to be faxed to [place] with the actual EKG.       Note

## 2023-02-22 NOTE — Telephone Encounter (Signed)
Per patient he is now a new patient as he was last seen at the Cardiologist so an appt with be Oct or Nov 2024 before he can be seen. I called and left a message that he will need to reach out to pcp to have done and it will need to be read and that report will need to be faxed to [place] with the actual EKG.

## 2023-02-23 NOTE — Telephone Encounter (Signed)
Patient says he has gotten the EKG today done at [facility] by Dr. Loretta Plume [phone], his nurse is Brandi. Per patient Merry Proud told him the Dr says the EKG was normal and he would write the report and fax the report and the scan to [place] and that the Doctor would be out of the office after today until next Tuesday 03/01/2023,but she would try and see if he could complete it today and she would fax the information over to [place] at that time/

## 2023-02-23 NOTE — Telephone Encounter (Signed)
Ann aware to check [employer] for the fax.

## 2023-02-24 NOTE — Telephone Encounter (Signed)
Per patient he checked with Dr. Aletha Halim office spoke with Idelia Salm advised him that the Doctor had started the note and had his computer with him and once it was done she would notify the patient that she had faxed it to this office.

## 2023-02-28 NOTE — Telephone Encounter (Signed)
I spoke with the patient and made him aware I have yet to received anything from Dr. Loni Dolly.

## 2023-03-01 NOTE — Telephone Encounter (Signed)
Spoke with the patient and made him aware that Brandie left a message here stating when the Doctor got the information to her she would send it over to [place].

## 2023-03-01 NOTE — Telephone Encounter (Signed)
I left a message with Tenile asked that she have Brandie call me regarding the patient EKG.

## 2023-03-01 NOTE — Telephone Encounter (Signed)
Brandi from Dr. Aletha Halim office called back and states once the Dr. Has completed the note for the EKG she will fax the report over.

## 2023-03-03 ENCOUNTER — Encounter (INDEPENDENT_AMBULATORY_CARE_PROVIDER_SITE_OTHER): Payer: Self-pay

## 2023-03-03 NOTE — Telephone Encounter (Signed)
EKG came in from Dr. Bland Span , I have placed on Chelsea's desk for review. Once she has reviewed I will send to patient assistance program.

## 2023-03-03 NOTE — Telephone Encounter (Signed)
Patient made aware we have received the EKG and I have placed this on Juan House's desk and once I receive it back I will fax to the Pap.

## 2023-03-03 NOTE — Telephone Encounter (Signed)
I have faxed the pap asked that it be expedited.

## 2023-03-07 ENCOUNTER — Encounter (INDEPENDENT_AMBULATORY_CARE_PROVIDER_SITE_OTHER): Payer: Self-pay

## 2023-03-09 ENCOUNTER — Encounter (INDEPENDENT_AMBULATORY_CARE_PROVIDER_SITE_OTHER): Payer: Self-pay

## 2023-03-09 ENCOUNTER — Telehealth (INDEPENDENT_AMBULATORY_CARE_PROVIDER_SITE_OTHER): Payer: Self-pay

## 2023-03-09 NOTE — Telephone Encounter (Signed)
Date: 03/09/2023 Member ID #: [id number] Authorization #: [id number] MEMBER NOTIFICATION OF PRESCRIPTION DRUG COVERAGE DETERMINATION Dear Verl Blalock You or your doctor asked [place] for VELSIPITY 2 MG TABLET. We approved this request from 12/08/2022 to 03/08/2024. This approval is for the drug only. A copy of this letter has been sent to your prescribing doctor. If you have any questions, please call our Member Services Department at [phone]), 24 hours a day, 7 days a week. Sincerely, Pharmacy Department [facility] (PDP)

## 2023-03-11 ENCOUNTER — Encounter (INDEPENDENT_AMBULATORY_CARE_PROVIDER_SITE_OTHER): Payer: Self-pay

## 2023-03-14 ENCOUNTER — Other Ambulatory Visit (INDEPENDENT_AMBULATORY_CARE_PROVIDER_SITE_OTHER): Payer: Self-pay | Admitting: Gastroenterology

## 2023-03-14 DIAGNOSIS — K51919 Ulcerative colitis, unspecified with unspecified complications: Secondary | ICD-10-CM

## 2023-03-14 MED ORDER — BUDESONIDE 3 MG PO CPEP: 9.0000 mg | ORAL_CAPSULE | Freq: Every day | ORAL | 0 refills | Status: DC

## 2023-03-15 ENCOUNTER — Telehealth (INDEPENDENT_AMBULATORY_CARE_PROVIDER_SITE_OTHER): Payer: Self-pay | Admitting: *Deleted

## 2023-03-15 ENCOUNTER — Other Ambulatory Visit (INDEPENDENT_AMBULATORY_CARE_PROVIDER_SITE_OTHER): Payer: Self-pay | Admitting: Gastroenterology

## 2023-03-15 NOTE — Telephone Encounter (Signed)
Copied from a staff message so I could document in chart:   Dolores Frame, MD  Meredith Leeds, CMA; Metro Kung, LPN; Raquel James, NP Hi /Crystal,  Can you please call the patient and check if he is OK with having his Entyvio induction and maintenance at [facility]? Or he needs to have this done somewhere else (in which case we will need to start the prior auth)? Also, he will need patient assistance from [employer]. Thanks  Thanks,  Katrinka Blazing, MD Gastroenterology and Hepatology [facility]enter[facility]

## 2023-03-15 NOTE — Telephone Encounter (Signed)
Discussed with patient. Patient is ok with doing entyvio induction and maintance dose at [facility]. He is going to fax me the entivyo patient assistance forms he has completed his portion. (  Do you want to put in infusion order or give me the dosage and I put in ) I will send steve marshall a message after order put in and Kim chambers for the precert.

## 2023-03-15 NOTE — Telephone Encounter (Signed)
Orders in for infusion induction and maintenance doses. Thanks

## 2023-03-15 NOTE — Telephone Encounter (Signed)
Left message to return call 

## 2023-03-16 ENCOUNTER — Other Ambulatory Visit: Payer: Self-pay

## 2023-03-16 NOTE — Telephone Encounter (Signed)
Juan House, Dr. Levon Hedger put in orders for [facility] for this patient.  It will need authorization and also pt wanted to do pt assist. He faxed me forms where he has done his part of the pt assistance. Can I send this to you or does someone else handle the patient assistance for infusions?

## 2023-03-17 ENCOUNTER — Encounter: Payer: Self-pay | Admitting: Gastroenterology

## 2023-03-17 NOTE — Telephone Encounter (Addendum)
Auth Submission:  Approved Site of care: Site of care: [facility] Payer: Medicare A/B, [employer] Supplement Medication & CPT/J Code(s) submitted: Entyvio (Vedolizumab) [id number] Route of submission (phone, fax, portal):  Phone # Fax # Auth type: Buy/Bill HB Units/visits requested: 300mg  q8weeks Reference number:  Approval from: 03/17/2023 to 08/16/2023

## 2023-03-18 NOTE — Telephone Encounter (Signed)
Hi Keyana,  I just faxed over patients pt assist forms. Please let me know if you do not receive them. Thanks.

## 2023-03-21 NOTE — Telephone Encounter (Signed)
[name], F/u: Please have MD sign forms and return to me. (MD also need to sign the forms) Patient also states he left a copy of tax forms, please fax those as well. [name]

## 2023-03-21 NOTE — Telephone Encounter (Signed)
Hi Kim/ Keyana, Sorry I faxed without the info needed. Thanks for letting me know. I am faxing back over forms that Dr. Levon Hedger signed and pt's tax info. ( I did not fill in infusion site details on form since I do not know that information ) please let me know if you do not receive. I am faxing now.  Thank you both.

## 2023-03-22 NOTE — Telephone Encounter (Signed)
[name], Appreciate your assistance.  I will forward the info to [employer] to begin the enrollment process.  Thanks. Kim :)

## 2023-03-30 ENCOUNTER — Encounter (INDEPENDENT_AMBULATORY_CARE_PROVIDER_SITE_OTHER): Payer: Self-pay

## 2023-04-01 ENCOUNTER — Encounter: Payer: Medicare Other | Attending: Gastroenterology | Admitting: *Deleted

## 2023-04-01 VITALS — BP 139/63 | HR 51 | Temp 97.9°F | Resp 18

## 2023-04-01 DIAGNOSIS — Z79620 Long term (current) use of immunosuppressive biologic: Secondary | ICD-10-CM | POA: Insufficient documentation

## 2023-04-01 DIAGNOSIS — K51919 Ulcerative colitis, unspecified with unspecified complications: Secondary | ICD-10-CM | POA: Insufficient documentation

## 2023-04-01 MED ORDER — VEDOLIZUMAB 300 MG IV SOLR
300.0000 mg | Freq: Once | INTRAVENOUS | Status: AC
  Administered 2023-04-01: 300 mg via INTRAVENOUS
  Filled 2023-04-01: qty 5

## 2023-04-01 NOTE — Progress Notes (Signed)
Diagnosis: Ulcerative Colitis  Provider:  Katrinka Blazing MD  Procedure: IV Infusion  IV Type: Peripheral, IV Location: L Antecubital  Entyvio (Vedolizumab), Dose: 300 mg  Infusion Start Time: 1039  Infusion Stop Time: 1120  Post Infusion IV Care: Observation period completed  Discharge: Condition: Good, Destination: Home . AVS Declined  Performed by:  Daleen Squibb, RN

## 2023-04-01 NOTE — Progress Notes (Deleted)
Diagnosis: {Diagnosis:25401}  Provider:  {CHINFMD:27988::"Praveen Mannam MD"}  Procedure: {Injection/Infusion:25339}  {IV Type:25393::"Peripheral"}, {IV Location:25394}  {Medications:25395}, {Dose:25397}  {Infusion Start Time:25399}  {Infusion Stop Time:25400}  Post Infusion IV Care: {CHINF Post Infusion:25398}  Discharge: {Condition:19696:::1}, {Destination:18313::"Home":1} . {CHINFAVS:28985}  Performed by:  Daleen Squibb, RN     Subjective:    Patient ID: Juan House, male    DOB: 11/02/1945, 77 y.o.   MRN: [id number]  HPI    Review of Systems     Objective:   Physical Exam        Assessment & Plan:

## 2023-04-15 ENCOUNTER — Encounter: Payer: Medicare Other | Admitting: Emergency Medicine

## 2023-04-15 VITALS — BP 148/67 | HR 52 | Temp 97.8°F | Resp 18

## 2023-04-15 DIAGNOSIS — K51919 Ulcerative colitis, unspecified with unspecified complications: Secondary | ICD-10-CM | POA: Diagnosis not present

## 2023-04-15 MED ORDER — VEDOLIZUMAB 300 MG IV SOLR
300.0000 mg | Freq: Once | INTRAVENOUS | Status: AC
  Administered 2023-04-15: 300 mg via INTRAVENOUS
  Filled 2023-04-15: qty 5

## 2023-04-15 NOTE — Progress Notes (Signed)
Diagnosis: Ulcerative Colitis  Provider:  Katrinka Blazing MD  Procedure: IV Infusion  IV Type: Peripheral, IV Location: R Antecubital  Entyvio (Vedolizumab), Dose: 300 mg  Infusion Start Time: 1035  Infusion Stop Time: 1102  Post Infusion IV Care: Observation period completed  Discharge: Condition: Good, Destination: Home . AVS Provided  Performed by:  Arrie Senate, RN

## 2023-04-22 ENCOUNTER — Other Ambulatory Visit: Payer: Self-pay | Admitting: Internal Medicine

## 2023-05-01 NOTE — Progress Notes (Signed)
 Subjective:   Patient ID: Juan House, male    DOB: [birth year]    MRN: [id number]    Brief patient profile:  73  yowm quit smoking 2006 and grew up in house full of smokers with tendency  Ear  infections as child never required surgery but as an adult started having problems with nasal congestion/ recurrent sinus infections in his 40s and required sinus surgery around 2013 and some better but then fairly soon after sinus surgery noted  more freq lower respiratory symptoms > recurrent cough/ pna 2016 > [place] eval with CT 09/26/14 showed only emphysema rec prn proair but found pt didn't really need it and did fine until Feb 2019 when started out with cold  Then sob /cough better with proair and rx with prednisone /abx back to nl then same symptoms in May 2019 then again [facility] June 2019 abx/ prednisone all better but referred 02/13/2018 by Dr Voncille Lo for ? Why recurrent bronchitis.   History of Present Illness  02/13/2018 1st [facility] office visit/ Juan House  s present complaints  Chief Complaint  Patient presents with   Pulmonary Consult    referred here by Dr. Voncille Lo, recurrent bronchitis  last proair 2 weeks prior to OV   Last pred x June 22   and last doxy was February 06 2018 and feels fine right now Has 2 story house, walks a mile and a half every day, some doe x hills sats never lower than 88% rec Plan A = Automatic = symbicort 80 Take 2 puffs first thing in am and then another 2 puffs about 12 hours later.  Work on inhaler technique:  Plan B = Backup Only use your albuterol as a rescue medication   05/02/2023 yearly  f/u ov/[place] office/Juan House re: cough variant asthma  maint on advair 115  / singulair  Chief Complaint  Patient presents with   Cough variant asthma  Dyspnea:  walking 30 min daily including hills  Cough: none  Sleeping: lie fine / one pillow s  resp cc  SABA use: none  02: none    No obvious day to day or daytime variability or assoc excess/  purulent sputum or mucus plugs or hemoptysis or cp or chest tightness, subjective wheeze or overt sinus or hb symptoms.    Also denies any obvious fluctuation of symptoms with weather or environmental changes or other aggravating or alleviating factors except as outlined above   No unusual exposure hx or h/o childhood pna/ asthma or knowledge of premature birth.  Current Allergies, Complete Past Medical History, Past Surgical History, Family History, and Social History were reviewed in [name].  ROS  The following are not active complaints unless bolded Hoarseness, sore throat, dysphagia, dental problems, itching, sneezing,  nasal congestion or discharge of excess mucus or purulent secretions, ear ache,   fever, chills, sweats, unintended wt loss or wt gain, classically pleuritic or exertional cp,  orthopnea pnd or arm/hand swelling  or leg swelling, presyncope, palpitations, abdominal pain, anorexia, nausea, vomiting, diarrhea  or change in bowel habits or change in bladder habits, change in stools or change in urine, dysuria, hematuria,  rash, arthralgias, visual complaints, headache, numbness, weakness or ataxia or problems with walking or coordination,  change in mood or  memory.        Current Meds - list is not complete - see meds at d/c  Medication Sig   gabapentin (NEURONTIN) 100 MG capsule Take  100 mg by mouth 3 (three) times daily.   vedolizumab (ENTYVIO) 300 MG injection Inject into the vein.                   Objective:   Physical Exam  Wts  05/02/2023       144  04/28/2022       151 05/04/2021       144  03/24/2020         150   02/20/2019        154   06/20/18 160 lb (72.6 kg)  03/20/18 160 lb (72.6 kg)  02/13/18 161 lb 3.2 oz (73.1 kg)    Vital signs reviewed  05/02/2023  - Note at rest 02 sats  98% on RA   General appearance:    amb thin wm nad    HEENT : Oropharynx  clear   Nasal turbinates nl    NECK :  without  apparent JVD/  palpable Nodes/TM    LUNGS: no acc muscle use,  Min barrel  contour chest wall with bilateral  slightly decreased bs s audible wheeze and  without cough on insp or exp maneuvers and min  Hyperresonant  to  percussion bilaterally    CV:  RRR  no s3 or murmur or increase in P2, and no edema   ABD:  soft and nontender with pos end  insp Hoover's  in the supine position.  No bruits or organomegaly appreciated   MS:  Nl gait/ ext warm without deformities Or obvious joint restrictions  calf tenderness, cyanosis or clubbing     SKIN: warm and dry without lesions    NEURO:  alert, approp, nl sensorium with  no motor or cerebellar deficits apparent.         CT chest 03/11/23 Bronchiectasis with scattered emphysema     Assessment & Plan:   Outpatient Encounter Medications as of 05/02/2023  Medication Sig   gabapentin (NEURONTIN) 100 MG capsule Take 100 mg by mouth 3 (three) times daily.   vedolizumab (ENTYVIO) 300 MG injection Inject into the vein.   ADVAIR HFA 115-21 MCG/ACT inhaler Inhale 2 puffs into the lungs 2 (two) times daily.   albuterol (VENTOLIN HFA) 108 (90 Base) MCG/ACT inhaler Inhale 2 puffs into the lungs every 6 (six) hours as needed for wheezing or shortness of breath.   Azelastine HCl 137 MCG/SPRAY SOLN Place 1 spray into both nostrils 2 (two) times daily.   budesonide (ENTOCORT EC) 3 MG 24 hr capsule Take 3 capsules (9 mg total) by mouth daily.   dicyclomine (BENTYL) 10 MG capsule Take 1 capsule (10 mg total) by mouth 2 (two) times daily as needed for spasms.   folic acid (FOLVITE) 1 MG tablet Take 1 mg by mouth 2 (two) times daily.   latanoprost (XALATAN) 0.005 % ophthalmic solution Place 1 drop into both eyes at bedtime.   mesalamine (LIALDA) 1.2 g EC tablet Take 4.8 g by mouth daily with breakfast.   montelukast (SINGULAIR) 10 MG tablet TAKE 1 TABLET BY MOUTH AT BEDTIME   omeprazole (PRILOSEC) 20 MG capsule Take 20 mg by mouth 2 (two) times daily before a meal.    Probiotic CAPS Take 1 capsule by mouth daily.   rOPINIRole (REQUIP) 0.25 MG tablet Take 0.5 mg by mouth 3 (three) times daily.   tamsulosin (FLOMAX) 0.4 MG CAPS capsule Take 0.4 mg by mouth 2 (two) times daily before lunch and supper.   No facility-administered encounter medications  on file as of 05/02/2023.

## 2023-05-02 ENCOUNTER — Encounter: Payer: Self-pay | Admitting: Internal Medicine

## 2023-05-02 ENCOUNTER — Ambulatory Visit: Payer: Medicare Other | Admitting: Internal Medicine

## 2023-05-02 VITALS — BP 140/70 | HR 58 | Ht 71.0 in | Wt 144.0 lb

## 2023-05-02 DIAGNOSIS — H2510 Age-related nuclear cataract, unspecified eye: Secondary | ICD-10-CM | POA: Insufficient documentation

## 2023-05-02 DIAGNOSIS — G459 Transient cerebral ischemic attack, unspecified: Secondary | ICD-10-CM | POA: Insufficient documentation

## 2023-05-02 DIAGNOSIS — J45991 Cough variant asthma: Secondary | ICD-10-CM | POA: Diagnosis not present

## 2023-05-02 DIAGNOSIS — H4010X0 Unspecified open-angle glaucoma, stage unspecified: Secondary | ICD-10-CM | POA: Insufficient documentation

## 2023-05-02 NOTE — Patient Instructions (Signed)
No change in medications   Please schedule a follow up visit in 12  months but call sooner if needed

## 2023-05-02 NOTE — Assessment & Plan Note (Addendum)
FENO 02/13/2018  =   294 on no rx  - Spirometry 02/13/2018  FEV1 3.29 (99%)  Ratio 80 on no rx  - Allergy profile 02/13/2018 >  Eos 0.4 /  IgE  1745  Grass, trees, dust  - 02/13/2018  After extensive coaching inhaler device  effectiveness =    90% > try symbicort 80 2bid  - Sinus CT 03/20/2018 >>>  1. Diffuse opacification of anterior and posterior ethmoid air cells and bilateral frontal sinuses. 2. Moderate mucosal thickening involving the dependent maxillary and ethmoid air cells bilaterally - FENO 03/20/2018  =   98 on 80 bid > increased to 160 2bid but changed on his own back to 80 - 02/20/2019    increased back to symb 160 2bid at pt request  - 03/24/2020  great control of symptoms so rec continue symb 160/singulair and f/u yearly   - 05/04/2021  After extensive coaching inhaler device,  effectiveness =    90%   All goals of chronic asthma control met including optimal function and elimination of symptoms with minimal need for rescue therapy.  Contingencies discussed in full including contacting this office immediately if not controlling the symptoms using the rule of two's.           Each maintenance medication was reviewed in detail including emphasizing most importantly the difference between maintenance and prns and under what circumstances the prns are to be triggered using an action plan format where appropriate.  Total time for H and P, chart review, counseling, reviewing hfa device(s) and generating customized AVS unique to this office visit / same day charting = 30 min yearly f/u ov     h

## 2023-05-13 ENCOUNTER — Encounter: Payer: Medicare Other | Attending: Gastroenterology | Admitting: *Deleted

## 2023-05-13 ENCOUNTER — Ambulatory Visit: Payer: Medicare Other

## 2023-05-13 VITALS — BP 145/62 | HR 52 | Temp 97.6°F | Resp 18

## 2023-05-13 DIAGNOSIS — K51919 Ulcerative colitis, unspecified with unspecified complications: Secondary | ICD-10-CM | POA: Insufficient documentation

## 2023-05-13 MED ORDER — VEDOLIZUMAB 300 MG IV SOLR
300.0000 mg | Freq: Once | INTRAVENOUS | Status: AC
  Administered 2023-05-13: 300 mg via INTRAVENOUS
  Filled 2023-05-13: qty 5

## 2023-05-13 NOTE — Progress Notes (Signed)
Diagnosis: Ulcerative Colitis  Provider:  Katrinka Blazing MD  Procedure: IV Infusion  IV Type: Peripheral, IV Location: R Antecubital  Entyvio (Vedolizumab), Dose: 300 mg  Infusion Start Time: 1256  Infusion Stop Time: 1330  Post Infusion IV Care: Observation period completed  Discharge: Condition: Good, Destination: Home . AVS Provided  Performed by:  Daleen Squibb, RN

## 2023-06-07 ENCOUNTER — Encounter (INDEPENDENT_AMBULATORY_CARE_PROVIDER_SITE_OTHER): Payer: Self-pay | Admitting: Gastroenterology

## 2023-06-17 ENCOUNTER — Encounter (INDEPENDENT_AMBULATORY_CARE_PROVIDER_SITE_OTHER): Payer: Self-pay

## 2023-06-20 NOTE — Telephone Encounter (Signed)
Forms filled out and placed on Chelsea's desk for signature.

## 2023-06-23 ENCOUNTER — Telehealth (INDEPENDENT_AMBULATORY_CARE_PROVIDER_SITE_OTHER): Payer: Self-pay

## 2023-06-23 NOTE — Telephone Encounter (Signed)
[employer] Patient assistance for Lialda Approved per Patient through 07/2024

## 2023-06-23 NOTE — Telephone Encounter (Signed)
Noted  

## 2023-07-07 ENCOUNTER — Encounter: Payer: Medicare Other | Attending: Gastroenterology | Admitting: Emergency Medicine

## 2023-07-07 VITALS — BP 119/63 | HR 52 | Temp 97.5°F | Resp 16

## 2023-07-07 DIAGNOSIS — K51919 Ulcerative colitis, unspecified with unspecified complications: Secondary | ICD-10-CM | POA: Insufficient documentation

## 2023-07-07 MED ORDER — VEDOLIZUMAB 300 MG IV SOLR
300.0000 mg | Freq: Once | INTRAVENOUS | Status: AC
Start: 2023-07-07 — End: 2023-07-07
  Administered 2023-07-07: 300 mg via INTRAVENOUS
  Filled 2023-07-07: qty 5

## 2023-07-07 NOTE — Progress Notes (Signed)
Diagnosis: Ulcerative Colitis  Provider:  Katrinka Blazing MD  Procedure: IV Infusion  IV Type: Peripheral, IV Location: L Antecubital  Entyvio (Vedolizumab), Dose: 300 mg  Infusion Start Time: 1256  Infusion Stop Time: 1335  Post Infusion IV Care: Peripheral IV Discontinued  Discharge: Condition: Good, Destination: Home . AVS Provided  Performed by:  Arrie Senate, RN

## 2023-07-08 ENCOUNTER — Ambulatory Visit: Payer: Medicare Other

## 2023-07-21 ENCOUNTER — Encounter (INDEPENDENT_AMBULATORY_CARE_PROVIDER_SITE_OTHER): Payer: Self-pay | Admitting: Gastroenterology

## 2023-07-21 ENCOUNTER — Ambulatory Visit (INDEPENDENT_AMBULATORY_CARE_PROVIDER_SITE_OTHER): Payer: Medicare Other | Admitting: Gastroenterology

## 2023-07-21 VITALS — BP 127/73 | HR 75 | Temp 97.8°F | Ht 71.0 in | Wt 142.7 lb

## 2023-07-21 DIAGNOSIS — K5130 Ulcerative (chronic) rectosigmoiditis without complications: Secondary | ICD-10-CM

## 2023-07-21 DIAGNOSIS — Z1321 Encounter for screening for nutritional disorder: Secondary | ICD-10-CM

## 2023-07-21 DIAGNOSIS — Z1159 Encounter for screening for other viral diseases: Secondary | ICD-10-CM

## 2023-07-21 DIAGNOSIS — K51018 Ulcerative (chronic) pancolitis with other complication: Secondary | ICD-10-CM

## 2023-07-21 DIAGNOSIS — K51919 Ulcerative colitis, unspecified with unspecified complications: Secondary | ICD-10-CM

## 2023-07-21 DIAGNOSIS — Z111 Encounter for screening for respiratory tuberculosis: Secondary | ICD-10-CM

## 2023-07-21 NOTE — Progress Notes (Signed)
Katrinka Blazing, M.D. Gastroenterology & Hepatology [facility] Gastroenterology [address] [place], [place] [address]  Primary Care Physician: Alinda Deem, MD [address] [place] [address]  I will communicate my assessment and recommendations to the referring MD via EMR.  Problems: Ulcerative colitis.  History of Present Illness: Arbin Wootton is a 77 y.o. male with past medical history of TIA, Ulcerative rectosigmoiditis and Barrett's esophagus, who presents for follow up of ulcerative colitis.  The patient was last seen on 01/27/2023. At that time, the patient was advised to continue Lialda 4.8 g daily.  Fecal calprotectin and CRP were checked on June 2024 which were elevated.  He was eventually started on Entyvio every 8 weeks given need to receive prednisone courses for intermittent flares of UC.  Patient reports that he is still having issues with urgency, usually after eating. He is having this at least once a week. He has a few days that he has constipation. Reports some nausea every couple of days. The patient denies having any vomiting, fever, chills, hematochezia, melena, hematemesis, abdominal distention, abdominal pain,  jaundice, pruritus or weight loss.  He has received 4 doses of Entyvio so far, he is due for the 5th dose in January.  Patient is asking if it is OK to restart ASA 81 mg as he was advised many years to stop it. He was taking this for TIA prevention.  Last labs in [facility] From 07/11/2023 showed WBC 4.8, hemoglobin 14.6, platelets 256, CMP with BUN 10, creatinine 1.06, sodium 140, potassium 3.9, calcium 9.6, AST 17, ALT 12, alkaline phosphatase 55, total bilirubin 2.0  Last flu shot:2024 Last pneumonia shot:yes, had it in the past Last evaluation by dermatology: 2 weeks ago Last zoster vaccine: possibly in 2021 COVID-19 shot: Booster in 2024   FLex sig: 08/2022 - Preparation of the colon was poor.                            - Diverticulosis in the sigmoid colon and in the                            descending colon.                           - Patent end-to-side colo-colonic anastomosis,                            characterized by healthy appearing mucosa.                           - Stool in the rectum, in the sigmoid colon and in                            the descending colon.                           - A single erosion in the rectum, likely secondary                            to trauma.                           -  No specimens collected. Last Endoscopy: April 2023, normal duodenum, normal mucosa in esophagus, erythema and edema, marked inflammation in antrum compatible with non erosive gastritis. 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   Enlarged prostate    Gilbert's syndrome    Goiter    Kidney cysts    Kidney stones 2012, 2016   Perforation of bladder 04/04/2017   Stroke (HCC) 08/16/2004   Ulcerative colitis (HCC) 2017    Past Surgical History: Past Surgical History:  Procedure Laterality Date   CHOLECYSTECTOMY     colon fistula     DRUG INDUCED ENDOSCOPY  11/2021   FLEXIBLE SIGMOIDOSCOPY N/A 09/09/2022   Procedure: FLEXIBLE SIGMOIDOSCOPY;  Surgeon: Dolores Frame, MD;  [facility]: [facility];  Service: Gastroenterology;  Laterality: N/A;  100pm, asa 3   HERNIA REPAIR Bilateral 1992   HERNIA REPAIR  03/2013   hernia, left side along with sperm cord lesion - Dr. Tilda Franco, [place]   NASAL SINUS SURGERY  Right 02/17/2022   growth right nasal passage   PROSTATE BIOPSY  2012   sigmodscopy  11/2021    Family History: Family History  Problem Relation Age of Onset   Cancer - Colon Mother    Pancreatic cancer Mother     Social History: Social History   Tobacco Use  Smoking Status Former   Current packs/day: 0.00   Average packs/day: 1 pack/day for 35.0 years (35.0 ttl pk-yrs)   Types: Cigarettes   Start date: 08/16/1969   Quit date: 08/16/2004   Years since quitting: 18.9   Passive exposure: Past  Smokeless Tobacco Never   Social History   Substance and Sexual Activity  Alcohol Use Never   Social History   Substance and Sexual Activity  Drug Use Never    Allergies: Allergies  Allergen Reactions   Sulfasalazine     Flu like symptoms, nausea, vomiting    Medications: Current Outpatient Medications  Medication Sig Dispense Refill   ADVAIR HFA 115-21 MCG/ACT inhaler Inhale 2 puffs into the lungs 2 (two) times daily. 12 g 4   albuterol (VENTOLIN HFA) 108 (90 Base) MCG/ACT inhaler Inhale 2 puffs into the lungs every 6 (six) hours as needed for wheezing or shortness of breath. 8 g 5   Azelastine HCl 137 MCG/SPRAY SOLN Place 1 spray into both nostrils 2 (two) times daily.     folic acid (FOLVITE) 1 MG tablet Take 1 mg by mouth 2 (two) times daily.     latanoprost (XALATAN) 0.005 % ophthalmic solution Place 1 drop into both eyes at bedtime.     mesalamine (LIALDA) 1.2 g EC tablet Take 4.8 g by mouth daily with breakfast.     montelukast (SINGULAIR) 10 MG tablet TAKE 1 TABLET BY MOUTH AT BEDTIME 90 tablet 0   omeprazole (PRILOSEC) 20 MG capsule Take 20 mg by mouth 2 (two) times daily before a meal.     Probiotic CAPS Take 1 capsule by mouth daily.     rOPINIRole (REQUIP) 0.25 MG tablet Take 1 mg by mouth 3 (three) times daily.  tamsulosin (FLOMAX) 0.4 MG CAPS capsule Take 0.4 mg by mouth 2 (two) times daily before lunch and supper.     vedolizumab (ENTYVIO) 300 MG injection  Inject into the vein.     No current facility-administered medications for this visit.    Review of Systems: GENERAL: negative for malaise, night sweats HEENT: No changes in hearing or vision, no nose bleeds or other nasal problems. NECK: Negative for lumps, goiter, pain and significant neck swelling RESPIRATORY: Negative for cough, wheezing CARDIOVASCULAR: Negative for chest pain, leg swelling, palpitations, orthopnea GI: SEE HPI MUSCULOSKELETAL: Negative for joint pain or swelling, back pain, and muscle pain. SKIN: Negative for lesions, rash PSYCH: Negative for sleep disturbance, mood disorder and recent psychosocial stressors. HEMATOLOGY Negative for prolonged bleeding, bruising easily, and swollen nodes. ENDOCRINE: Negative for cold or heat intolerance, polyuria, polydipsia and goiter. NEURO: negative for tremor, gait imbalance, syncope and seizures. The remainder of the review of systems is noncontributory.   Physical Exam: BP 127/73 (BP Location: Left Arm, Patient Position: Sitting, Cuff Size: Normal)   Pulse 75   Temp 97.8 F (36.6 C) (Temporal)   Ht 5\' 11"  (1.803 m)   Wt 142 lb 11.2 oz (64.7 kg)   BMI 19.90 kg/m  GENERAL: The patient is AO x3, in no acute distress. HEENT: Head is normocephalic and atraumatic. EOMI are intact. Mouth is well hydrated and without lesions. NECK: Supple. No masses LUNGS: Clear to auscultation. No presence of rhonchi/wheezing/rales. Adequate chest expansion HEART: RRR, normal s1 and s2. ABDOMEN: Soft, nontender, no guarding, no peritoneal signs, and nondistended. BS +. No masses. EXTREMITIES: Without any cyanosis, clubbing, rash, lesions or edema. NEUROLOGIC: AOx3, no focal motor deficit. SKIN: no jaundice, no rashes  Imaging/Labs: as above  I personally reviewed and interpreted the available labs, imaging and endoscopic files.  Impression and Plan: Jkai Elfman is a 77 y.o. male with past medical history of TIA, Ulcerative  rectosigmoiditis and Barrett's esophagus, who presents for follow up of ulcerative colitis.  The patient has presented improvement of his symptoms while on Entyvio, although he has still not achieved complete clinical remission of his symptoms.  He has tolerated the medication adequately and denies any complaints.  At this point, we will continue with Entyvio every 8 weeks and may consider checking Entyvio levels in the follow-up appointment the day before his next dose.  If the dose is optimized and he has achieved significant clinical improvement, we may consider proceeding with a colonoscopy.  Will obtain surveillance labs today As well.  Finally, he can consider stopping mesalamine compounds as they have not been shown to change the disease course while on combination with an advanced treatment for ulcerative colitis.  If symptoms worsen, he can restart mesalamine as usual.  -Check CRP, vitamin D, hepatitis B surface antigen and QuantiFERON -Continue Entyvio every 8 weeks -Can stop mesalamine/Lialda for now, if presenting worsening diarrhea after a couple weeks can restart -We will discuss possibly checking levels of Entyvio in next appointment and possible surveillance colonoscopy.  All questions were answered.      Katrinka Blazing, MD Gastroenterology and Hepatology [facility] Gastroenterology

## 2023-07-21 NOTE — Patient Instructions (Signed)
Perform blood workup Continue Entyvio every 8 weeks Can stop mesalamine/Lialda for now, if presenting worsening diarrhea after a couple weeks can restart We will discuss possibly checking levels of Entyvio in next appointment and possible surveillance colonoscopy.

## 2023-07-28 ENCOUNTER — Ambulatory Visit (INDEPENDENT_AMBULATORY_CARE_PROVIDER_SITE_OTHER): Payer: Medicare Other | Admitting: Gastroenterology

## 2023-07-29 ENCOUNTER — Other Ambulatory Visit (INDEPENDENT_AMBULATORY_CARE_PROVIDER_SITE_OTHER): Payer: Self-pay | Admitting: Gastroenterology

## 2023-08-03 LAB — C-REACTIVE PROTEIN: CRP: 1 mg/L (ref 0–10)

## 2023-08-03 LAB — QUANTIFERON-TB GOLD PLUS
QuantiFERON Nil Value: 0.04 [IU]/mL
QuantiFERON TB1 Ag Value: 0.04 [IU]/mL
QuantiFERON TB2 Ag Value: 0.04 [IU]/mL

## 2023-08-03 LAB — HEPATITIS B SURFACE ANTIGEN: Hepatitis B Surface Ag: NEGATIVE

## 2023-08-03 LAB — VITAMIN D 25 HYDROXY (VIT D DEFICIENCY, FRACTURES): Vit D, 25-Hydroxy: 27.6 ng/mL — ABNORMAL LOW (ref 30.0–100.0)

## 2023-08-11 ENCOUNTER — Other Ambulatory Visit (INDEPENDENT_AMBULATORY_CARE_PROVIDER_SITE_OTHER): Payer: Self-pay | Admitting: Gastroenterology

## 2023-08-11 DIAGNOSIS — E559 Vitamin D deficiency, unspecified: Secondary | ICD-10-CM

## 2023-08-11 MED ORDER — ERGOCALCIFEROL 50 MCG (2000 UT) PO TABS: 1.0000 | ORAL_TABLET | Freq: Every day | ORAL | 3 refills | Status: AC

## 2023-08-12 ENCOUNTER — Encounter (INDEPENDENT_AMBULATORY_CARE_PROVIDER_SITE_OTHER): Payer: Self-pay

## 2023-08-18 ENCOUNTER — Encounter (INDEPENDENT_AMBULATORY_CARE_PROVIDER_SITE_OTHER): Payer: Self-pay

## 2023-08-18 ENCOUNTER — Telehealth (INDEPENDENT_AMBULATORY_CARE_PROVIDER_SITE_OTHER): Payer: Self-pay

## 2023-08-18 NOTE — Telephone Encounter (Signed)
 Ergocalciferol  50 MCG (2000 UT) TABS 90 tablet 3 08/11/2023 --   Sig - Route: Take 1 tablet by mouth daily. - Oral   Sent to pharmacy as: Ergocalciferol  50 MCG (2000 UT) Tab   E-Prescribing Status: Receipt confirmed by pharmacy (08/11/2023  6:00 PM EST)    Associated Diagnoses  Vitamin D  deficiency  - Primary     Pharmacy  [facility] - [place], [place] - [address]  Per patient [facility] does not have this in stock, and he says there is a national back order on this,and no other pharmacies have it as they all use the same warehouse. Patient wants to know if there is an alternate medication he can use. Please advise.

## 2023-08-18 NOTE — Telephone Encounter (Signed)
 Yes

## 2023-08-18 NOTE — Telephone Encounter (Signed)
 Is that Regular over the counter Vit D one per day?

## 2023-08-18 NOTE — Telephone Encounter (Signed)
 He can buy the over the counter vitamin D supplementation from any pharmacy. Also advise him to follow with PCP regarding monitoring this. Thanks

## 2023-09-01 ENCOUNTER — Ambulatory Visit: Payer: Medicare Other

## 2023-09-07 ENCOUNTER — Encounter: Payer: Medicare Other | Attending: Gastroenterology

## 2023-09-07 ENCOUNTER — Telehealth: Payer: Self-pay

## 2023-09-07 VITALS — BP 118/62 | HR 51 | Temp 97.4°F | Resp 16

## 2023-09-07 DIAGNOSIS — K51919 Ulcerative colitis, unspecified with unspecified complications: Secondary | ICD-10-CM | POA: Insufficient documentation

## 2023-09-07 MED ORDER — VEDOLIZUMAB 300 MG IV SOLR
300.0000 mg | Freq: Once | INTRAVENOUS | Status: AC
  Administered 2023-09-07: 300 mg via INTRAVENOUS
  Filled 2023-09-07: qty 5

## 2023-09-07 NOTE — Telephone Encounter (Addendum)
 Auth Submission: NO AUTH NEEDED Site of care: Site of care: [facility] Payer: medicare a/b, [employer] [place] supp Medication & CPT/J Code(s) submitted: Entyvio  (Vedolizumab ) J3380 Route of submission (phone, fax, portal): portal Phone # Fax # Auth type: Buy/Bill HB Units/visits requested: 300mg , q8weeks Reference number:  Approval from: 09/07/23 to 08/15/24

## 2023-09-07 NOTE — Progress Notes (Signed)
Diagnosis: Ulcerative Colitis  Provider:  Katrinka Blazing MD  Procedure: IV Infusion  IV Type: Peripheral, IV Location: L Antecubital  Entyvio (Vedolizumab), Dose: 300 mg  Infusion Start Time: 1047  Infusion Stop Time: 1125  Post Infusion IV Care: Peripheral IV Discontinued  Discharge: Condition: Good, Destination: Home . AVS Provided  Performed by:  Arrie Senate, RN

## 2023-09-09 ENCOUNTER — Ambulatory Visit: Payer: Medicare Other

## 2023-09-26 ENCOUNTER — Other Ambulatory Visit: Payer: Self-pay | Admitting: Internal Medicine

## 2023-09-26 MED ORDER — MONTELUKAST SODIUM 10 MG PO TABS: 10.0000 mg | ORAL_TABLET | Freq: Every day | ORAL | 1 refills | Status: DC

## 2023-11-02 ENCOUNTER — Encounter: Payer: Medicare Other | Attending: Gastroenterology | Admitting: Emergency Medicine

## 2023-11-02 VITALS — BP 113/63 | HR 56 | Temp 97.7°F | Resp 16

## 2023-11-02 DIAGNOSIS — Z79620 Long term (current) use of immunosuppressive biologic: Secondary | ICD-10-CM | POA: Diagnosis not present

## 2023-11-02 DIAGNOSIS — K51919 Ulcerative colitis, unspecified with unspecified complications: Secondary | ICD-10-CM | POA: Diagnosis present

## 2023-11-02 MED ORDER — VEDOLIZUMAB 300 MG IV SOLR
300.0000 mg | Freq: Once | INTRAVENOUS | Status: AC
  Administered 2023-11-02: 300 mg via INTRAVENOUS
  Filled 2023-11-02: qty 5

## 2023-11-02 NOTE — Progress Notes (Signed)
 Diagnosis: Ulcerative Colitis  Provider:  Katrinka Blazing MD  Procedure: IV Infusion  IV Type: Peripheral, IV Location: L Antecubital  Entyvio (Vedolizumab), Dose: 300 mg  Infusion Start Time: 1257  Infusion Stop Time: 1336  Post Infusion IV Care: Peripheral IV Discontinued  Discharge: Condition: Good, Destination: Home . AVS Provided  Performed by:  Arrie Senate, RN

## 2023-11-12 ENCOUNTER — Encounter: Payer: Self-pay | Admitting: Internal Medicine

## 2023-11-14 MED ORDER — FLUTICASONE-SALMETEROL 250-50 MCG/ACT IN AEPB: 1.0000 | INHALATION_SPRAY | Freq: Two times a day (BID) | RESPIRATORY_TRACT | 11 refills | Status: DC

## 2023-11-14 NOTE — Telephone Encounter (Signed)
 Done

## 2023-12-28 ENCOUNTER — Encounter: Attending: Gastroenterology | Admitting: Emergency Medicine

## 2023-12-28 VITALS — BP 142/64 | HR 47 | Temp 97.6°F | Resp 16

## 2023-12-28 DIAGNOSIS — K51919 Ulcerative colitis, unspecified with unspecified complications: Secondary | ICD-10-CM | POA: Insufficient documentation

## 2023-12-28 MED ORDER — VEDOLIZUMAB 300 MG IV SOLR
300.0000 mg | Freq: Once | INTRAVENOUS | Status: AC
  Administered 2023-12-28: 300 mg via INTRAVENOUS
  Filled 2023-12-28: qty 5

## 2023-12-28 NOTE — Patient Instructions (Signed)
[id number]  

## 2023-12-28 NOTE — Progress Notes (Signed)
 Diagnosis: Ulcerative Colitis  Provider:  Samantha Cress MD  Procedure: IV Infusion  IV Type: Peripheral, IV Location: L Antecubital  Entyvio  (Vedolizumab ), Dose: 300 mg  Infusion Start Time: 1259  Infusion Stop Time: 1331  Post Infusion IV Care: Peripheral IV Discontinued  Discharge: Condition: Good, Destination: Home . AVS Provided  Performed by:  Arlina Benjamin, RN

## 2023-12-29 ENCOUNTER — Encounter (INDEPENDENT_AMBULATORY_CARE_PROVIDER_SITE_OTHER): Payer: Self-pay | Admitting: Gastroenterology

## 2024-01-10 ENCOUNTER — Ambulatory Visit (INDEPENDENT_AMBULATORY_CARE_PROVIDER_SITE_OTHER): Admitting: Gastroenterology

## 2024-01-10 ENCOUNTER — Encounter (INDEPENDENT_AMBULATORY_CARE_PROVIDER_SITE_OTHER): Payer: Self-pay | Admitting: Gastroenterology

## 2024-01-10 VITALS — BP 124/65 | HR 67 | Temp 97.8°F | Ht 71.0 in | Wt 153.0 lb

## 2024-01-10 DIAGNOSIS — Z5181 Encounter for therapeutic drug level monitoring: Secondary | ICD-10-CM

## 2024-01-10 DIAGNOSIS — K5190 Ulcerative colitis, unspecified, without complications: Secondary | ICD-10-CM | POA: Diagnosis not present

## 2024-01-10 DIAGNOSIS — K51919 Ulcerative colitis, unspecified with unspecified complications: Secondary | ICD-10-CM

## 2024-01-10 NOTE — Progress Notes (Addendum)
 Referring Provider: Linford Ribas, MD Primary Care Physician:  Linford Ribas, MD Primary GI Physician: Dr. Sammi Crick   Chief Complaint  Patient presents with   Follow-up    Pt arrives for follow up. Pt is taking Entyvio . Pt is having some constipation. Pt does have to strain. Last dose of Entyvio  given 12/28/23   HPI:   Juan House is a 78 y.o. male with past medical history of TIA, Ulcerative rectosigmoiditis and Barrett's esophagus   Patient presenting today for:  Follow up of Ulcerative coltis on Entyvio  (since August 2024)  Last seen December 2024, at that time still having fecal urgency, some constipation, nausea a few days. Had received 4 doses of Entyvio , 5th dose upcoming in January.    Recommended to check CRP, Vitamin D , Hep BsAg, TB quant, continue entyvio  every 8 weeks, stop mesalamine, consider checking entyvio  levels at f/u and possible colonoscopy pending clinical course  Labs in December 2024: CRP 1 Hep B, TB quant negative  Vitamin D  low at 27.6, advised to start vitamin D  supplement  Present:  Doing well on entyvio . He notes some constipation, having a BM on average usually once per day, sometimes twice. Constipation is occurring maybe once every week or so noting some harder stools and having to strain some to defecate. No abdominal pain, rectal bleeding or melena.  Not taking anything currently for his constipation as he was unsure if he could take a stool softener or not. Has upcoming basic labs on 01/18/24 with PCP. Drinking about 3 bottles of water per day and exercising daily. Notes some ongoing nausea, dizziness though this is secondary to his RLS medication as it begins after each dose of this, which he is going to discuss with PCP at upcoming visit.    Last flu shot:2024 Last pneumonia shot:yes, had it in the past Last evaluation by dermatology: December 2024  Last zoster vaccine: possibly in 2021 COVID-19 shot: Booster in 2024   FLex sig: 08/2022  - Preparation of the colon was poor.                           - Diverticulosis in the sigmoid colon and in the                            descending colon.                           - Patent end-to-side colo-colonic anastomosis,                            characterized by healthy appearing mucosa.                           - Stool in the rectum, in the sigmoid colon and in                            the descending colon.                           - A single erosion in the rectum, likely secondary  to trauma.                           - No specimens collected. Last Endoscopy: April 2023, normal duodenum, normal mucosa in esophagus, erythema and edema, marked inflammation in antrum compatible with non erosive gastritis. 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   Enlarged prostate    Gilbert's syndrome    Goiter    Kidney cysts    Kidney stones 2012, 2016   Perforation of bladder 04/04/2017   Stroke (HCC) 08/16/2004   Ulcerative colitis (HCC) 2017    Past Surgical History:  Procedure Laterality Date   CHOLECYSTECTOMY     colon fistula     DRUG INDUCED ENDOSCOPY  11/2021   FLEXIBLE SIGMOIDOSCOPY N/A 09/09/2022   Procedure: FLEXIBLE SIGMOIDOSCOPY;  Surgeon: Urban Garden, MD;  Location: [facility];  Service: Gastroenterology;  Laterality: N/A;  100pm, asa 3   HERNIA REPAIR Bilateral 1992   HERNIA REPAIR  03/2013   hernia, left side along with sperm cord lesion - Dr. Odetta Benes, [place]   NASAL SINUS SURGERY Right 02/17/2022   growth  right nasal passage   PROSTATE BIOPSY  2012   sigmodscopy  11/2021    Current Outpatient Medications  Medication Sig Dispense Refill   Aspirin 81 MG CAPS Take 1 capsule every day by oral route.     Azelastine HCl 137 MCG/SPRAY SOLN Place 1 spray into both nostrils 2 (two) times daily.     Ergocalciferol  50 MCG (2000 UT) TABS Take 1 tablet by mouth daily. 90 tablet 3   fluticasone -salmeterol (ADVAIR ) 250-50 MCG/ACT AEPB Inhale 1 puff into the lungs every 12 (twelve) hours. 60 each 11   folic acid (FOLVITE) 1 MG tablet Take 1 mg by mouth 2 (two) times daily.     latanoprost (XALATAN) 0.005 % ophthalmic solution Place 1 drop into both eyes at bedtime.     montelukast  (SINGULAIR ) 10 MG tablet Take 1 tablet (10 mg total) by mouth at bedtime. TAKE 1 TABLET BY MOUTH AT BEDTIME 90 tablet 1   omeprazole (PRILOSEC) 20 MG capsule Take 20 mg by mouth 2 (two) times daily before a meal.     Probiotic CAPS Take 1 capsule by mouth daily.     rOPINIRole (REQUIP) 0.25 MG tablet Take 1 mg by mouth 3 (three) times daily.     tamsulosin (FLOMAX) 0.4 MG CAPS capsule Take 0.4 mg by mouth 2 (two) times daily before lunch and supper.     vedolizumab  (ENTYVIO ) 300 MG injection Inject into the vein.     albuterol  (VENTOLIN  HFA) 108 (90 Base) MCG/ACT inhaler Inhale 2 puffs into the lungs every 6 (six) hours as needed for wheezing or shortness of breath. 8 g 5   No current facility-administered medications for this visit.    Allergies as of 01/10/2024 - Review Complete 01/10/2024  Allergen Reaction Noted   Sulfasalazine  07/26/2022    Social History   Socioeconomic History   Marital status: Married    Spouse name: Not on file   Number of children: Not on  file   Years of education: Not on file   Highest education level: Not on file  Occupational History   Not on file  Tobacco Use   Smoking status: Former    Current packs/day: 0.00    Average packs/day: 1 pack/day for 35.0 years (35.0 ttl pk-yrs)     Types: Cigarettes    Start date: 08/16/1969    Quit date: 08/16/2004    Years since quitting: 19.4    Passive exposure: Past   Smokeless tobacco: Never  Vaping Use   Vaping status: Never Used  Substance and Sexual Activity   Alcohol use: Never   Drug use: Never   Sexual activity: Not on file  Other Topics Concern   Not on file  Social History Narrative   Not on file   Social Drivers of Health   Financial Resource Strain: Not on file  Food Insecurity: Not on file  Transportation Needs: Not on file  Physical Activity: Not on file  Stress: Not on file  Social Connections: Not on file    Review of systems General: negative for malaise, night sweats, fever, chills, weight loss Neck: Negative for lumps, goiter, pain and significant neck swelling Resp: Negative for cough, wheezing, dyspnea at rest CV: Negative for chest pain, leg swelling, palpitations, orthopnea GI: denies melena, hematochezia, nausea, vomiting, diarrhea, dysphagia, odyonophagia, early satiety or unintentional weight loss. +constipation  MSK: Negative for joint pain or swelling, back pain, and muscle pain. Derm: Negative for itching or rash Psych: Denies depression, anxiety, memory loss, confusion. No homicidal or suicidal ideation.  Heme: Negative for prolonged bleeding, bruising easily, and swollen nodes. Endocrine: Negative for cold or heat intolerance, polyuria, polydipsia and goiter. Neuro: negative for tremor, gait imbalance, syncope and seizures. The remainder of the review of systems is noncontributory.  Physical Exam: BP 124/65   Pulse 67   Temp 97.8 F (36.6 C)   Ht 5\' 11"  (1.803 m)   Wt 153 lb (69.4 kg)   BMI 21.34 kg/m  General:   Alert and oriented. No distress noted. Pleasant and cooperative.  Head:  Normocephalic and atraumatic. Eyes:  Conjuctiva clear without scleral icterus. Mouth:  Oral mucosa pink and moist. Good dentition. No lesions. Heart: Normal rate and rhythm, s1 and s2 heart  sounds present.  Lungs: Clear lung sounds in all lobes. Respirations equal and unlabored. Abdomen:  +BS, soft, non-tender and non-distended. No rebound or guarding. No HSM or masses noted. Derm: No palmar erythema or jaundice Msk:  Symmetrical without gross deformities. Normal posture. Extremities:  Without edema. Neurologic:  Alert and  oriented x4 Psych:  Alert and cooperative. Normal mood and affect.  Invalid input(s): "6 MONTHS"   ASSESSMENT: Juan House is a 78 y.o. male presenting today for follow up of ulcerative colitis on Entyvio    Patient with history of ulcerative rectosigmoiditis on Entyvio  August 2024 with improvement of symptoms Having 1-2 formed stools per day with no abdominal pain or rectal bleeding, some harder stools at times though is not taking anything currently for constipation.  Overall he seems to be tolerating Entyvio  well.  He has upcoming basic labs with his PCP which she will have them forward resulted.  Would recommend checking Entyvio  levels the day before or his next Entyvio  dose in August.  As he has had significant clinical improvement, would recommend proceeding with colonoscopy at this time as well.  He is unable to schedule in June due to other obligations, is agreeable to try to get  colonoscopy scheduled in July.  PLAN:  -continue entyvio  every 8 weeks  -check entyvio  levels prior to August dose  -labs with PCP in June, pt will have them forward those to us  after -schedule colonoscopy (July as pt cannot do June, will call once July schedule open) ASA II  -can start stool softener every other day to daily  -continue with daily exercise, and good water intake  All questions were answered, patient verbalized understanding and is in agreement with plan as outlined above.    Follow Up: 6 months   Damarius Karnes L. Adrien Alberta, MSN, APRN, AGNP-C Adult-Gerontology Nurse Practitioner [facility]  I have reviewed the note and agree with the  APP's assessment as described in this progress note  Samantha Cress, MD Gastroenterology and Hepatology [facility] Gastroenterology

## 2024-01-10 NOTE — Patient Instructions (Signed)
-  continue entyvio  every 8 weeks  -check entyvio  levels prior to August dose (please do this the day before) -please have PCP forward upcoming labs in June so we can add these to our records -due for colonoscopy, we will contact you once we have the July schedule open to get you on for this  - you can try taking a stool softener every other day to every day for constipation  -continue with daily exercise, and good water intake as you are doing  Follow up 6 months  It was a pleasure to see you today. I want to create trusting relationships with patients and provide genuine, compassionate, and quality care. I truly value your feedback! please be on the lookout for a survey regarding your visit with me today. I appreciate your input about our visit and your time in completing this!    Airianna Kreischer L. Dannika Hilgeman, MSN, APRN, AGNP-C Adult-Gerontology Nurse Practitioner [facility] Gastroenterology at [facility]

## 2024-01-18 ENCOUNTER — Telehealth: Payer: Self-pay | Admitting: *Deleted

## 2024-01-18 MED ORDER — PEG 3350-KCL-NA BICARB-NACL 420 G PO SOLR: 4000.0000 mL | Freq: Once | ORAL | 0 refills | Status: AC

## 2024-01-18 NOTE — Telephone Encounter (Signed)
 Spoke with pt. He has been scheduled for TCS with Dr. Sammi Crick, ASA 2 on 7/23. He is aware will mail instructions to him, rx for prep to be sent to his pharmacy.

## 2024-01-26 ENCOUNTER — Telehealth (INDEPENDENT_AMBULATORY_CARE_PROVIDER_SITE_OTHER): Payer: Self-pay | Admitting: Gastroenterology

## 2024-01-26 NOTE — Telephone Encounter (Signed)
 Message from Cc'd Charts: Patterson Bora, NP  Rozann Cornell, LPN Labs from PCP reviewed, labs look good. no further labs needed at this time.  Pt contacted. Pt states he meet with his PCP and went over his labs. PCP stated labs looked good. Pt verbalized understanding.

## 2024-01-30 ENCOUNTER — Encounter (INDEPENDENT_AMBULATORY_CARE_PROVIDER_SITE_OTHER): Payer: Self-pay

## 2024-02-19 ENCOUNTER — Encounter (INDEPENDENT_AMBULATORY_CARE_PROVIDER_SITE_OTHER): Payer: Self-pay | Admitting: Gastroenterology

## 2024-02-22 ENCOUNTER — Telehealth (INDEPENDENT_AMBULATORY_CARE_PROVIDER_SITE_OTHER): Payer: Self-pay

## 2024-02-22 ENCOUNTER — Other Ambulatory Visit (HOSPITAL_COMMUNITY)
Admission: RE | Admit: 2024-02-22 | Discharge: 2024-02-22 | Disposition: A | Discharge status: Home / Self Care | Source: Other Acute Inpatient Hospital | Attending: Gastroenterology | Admitting: Gastroenterology

## 2024-02-22 ENCOUNTER — Other Ambulatory Visit (INDEPENDENT_AMBULATORY_CARE_PROVIDER_SITE_OTHER): Payer: Self-pay

## 2024-02-22 ENCOUNTER — Encounter: Attending: Gastroenterology | Admitting: Emergency Medicine

## 2024-02-22 VITALS — BP 124/68 | HR 49 | Temp 97.7°F | Resp 16

## 2024-02-22 DIAGNOSIS — K51919 Ulcerative colitis, unspecified with unspecified complications: Secondary | ICD-10-CM

## 2024-02-22 DIAGNOSIS — K639 Disease of intestine, unspecified: Secondary | ICD-10-CM

## 2024-02-22 MED ORDER — VEDOLIZUMAB 300 MG IV SOLR
300.0000 mg | Freq: Once | INTRAVENOUS | Status: AC
  Administered 2024-02-22: 300 mg via INTRAVENOUS
  Filled 2024-02-22: qty 5

## 2024-02-22 NOTE — Progress Notes (Signed)
 Diagnosis: Ulcerative Colitis  Provider:  Eartha Sieving MD  Procedure: IV Infusion  IV Type: Peripheral, IV Location: L Antecubital  Entyvio  (Vedolizumab ), Dose: 300 mg  Infusion Start Time: 1306  Infusion Stop Time: 1340  Post Infusion IV Care: Peripheral IV Discontinued  Discharge: Condition: Good, Destination: Home . AVS Declined  Performed by:  Delon ONEIDA Officer, RN

## 2024-02-22 NOTE — Telephone Encounter (Signed)
 Patient had lab (Vedolizumab  and Anti Vedo Ab) drawn on Monday, which was too early to have drawn for adequate drug levels. Patient at the infusion clinic today 02/22/2024 to have infusion. While there prior to infusion they drew the patient blood and Bari Caldron, came over to see if we would like to have the lab sent to the lab. I spoke with Dr. Eartha, and he said yes, please send it off for the Vedolizumab  and Anti Vedo Ab. I placed the orders in Epic for [facility] and gave the orders to [employer]. She will send the blood work off to [facility] as requested.

## 2024-02-22 NOTE — Telephone Encounter (Signed)
 Thanks for arranging this Crystal, good job!

## 2024-02-28 ENCOUNTER — Ambulatory Visit (INDEPENDENT_AMBULATORY_CARE_PROVIDER_SITE_OTHER): Payer: Self-pay | Admitting: Gastroenterology

## 2024-02-28 LAB — VEDOLIZUMAB AND ANTI-VEDO AB
Anti-Vedolizumab Antibody: <25 ng/mL
Vedolizumab: 20 ug/mL

## 2024-02-28 LAB — SERIAL MONITORING

## 2024-02-29 ENCOUNTER — Ambulatory Visit

## 2024-02-29 ENCOUNTER — Other Ambulatory Visit: Payer: Self-pay | Admitting: Medical Genetics

## 2024-03-01 ENCOUNTER — Ambulatory Visit (INDEPENDENT_AMBULATORY_CARE_PROVIDER_SITE_OTHER): Payer: Self-pay | Admitting: Gastroenterology

## 2024-03-01 LAB — MISC LABCORP TEST (SEND OUT): Labcorp test code: 504567

## 2024-03-06 ENCOUNTER — Encounter (INDEPENDENT_AMBULATORY_CARE_PROVIDER_SITE_OTHER): Payer: Self-pay

## 2024-03-07 ENCOUNTER — Ambulatory Visit (HOSPITAL_COMMUNITY): Payer: Self-pay | Admitting: Anesthesiology

## 2024-03-07 ENCOUNTER — Ambulatory Visit (HOSPITAL_COMMUNITY)
Admission: RE | Admit: 2024-03-07 | Discharge: 2024-03-07 | Disposition: A | Discharge status: Home / Self Care | Attending: Gastroenterology | Admitting: Gastroenterology

## 2024-03-07 ENCOUNTER — Encounter (HOSPITAL_COMMUNITY)
Admission: RE | Disposition: A | Discharge status: Home / Self Care | Payer: Self-pay | Source: Home / Self Care | Attending: Gastroenterology

## 2024-03-07 ENCOUNTER — Other Ambulatory Visit: Payer: Self-pay

## 2024-03-07 ENCOUNTER — Encounter (HOSPITAL_COMMUNITY): Payer: Self-pay | Admitting: Gastroenterology

## 2024-03-07 DIAGNOSIS — Z7722 Contact with and (suspected) exposure to environmental tobacco smoke (acute) (chronic): Secondary | ICD-10-CM | POA: Diagnosis not present

## 2024-03-07 DIAGNOSIS — K5130 Ulcerative (chronic) rectosigmoiditis without complications: Secondary | ICD-10-CM | POA: Insufficient documentation

## 2024-03-07 DIAGNOSIS — Z8711 Personal history of peptic ulcer disease: Secondary | ICD-10-CM | POA: Insufficient documentation

## 2024-03-07 DIAGNOSIS — Z8673 Personal history of transient ischemic attack (TIA), and cerebral infarction without residual deficits: Secondary | ICD-10-CM | POA: Diagnosis not present

## 2024-03-07 DIAGNOSIS — I10 Essential (primary) hypertension: Secondary | ICD-10-CM | POA: Diagnosis not present

## 2024-03-07 DIAGNOSIS — Z980 Intestinal bypass and anastomosis status: Secondary | ICD-10-CM | POA: Diagnosis not present

## 2024-03-07 DIAGNOSIS — J45909 Unspecified asthma, uncomplicated: Secondary | ICD-10-CM | POA: Insufficient documentation

## 2024-03-07 DIAGNOSIS — K5730 Diverticulosis of large intestine without perforation or abscess without bleeding: Secondary | ICD-10-CM | POA: Diagnosis not present

## 2024-03-07 DIAGNOSIS — Z87891 Personal history of nicotine dependence: Secondary | ICD-10-CM | POA: Diagnosis not present

## 2024-03-07 HISTORY — PX: COLONOSCOPY: SHX5424

## 2024-03-07 LAB — HM COLONOSCOPY

## 2024-03-07 SURGERY — COLONOSCOPY
Anesthesia: General

## 2024-03-07 MED ORDER — LACTATED RINGERS IV SOLN: INTRAVENOUS | Status: DC

## 2024-03-07 MED ORDER — EPHEDRINE SULFATE-NACL 50-0.9 MG/10ML-% IV SOSY
PREFILLED_SYRINGE | INTRAVENOUS | Status: DC | PRN
  Administered 2024-03-07 (×2): 10 mg via INTRAVENOUS

## 2024-03-07 MED ORDER — LIDOCAINE 2% (20 MG/ML) 5 ML SYRINGE
INTRAMUSCULAR | Status: DC | PRN
  Administered 2024-03-07: 60 mg via INTRAVENOUS

## 2024-03-07 MED ORDER — PROPOFOL 10 MG/ML IV BOLUS
INTRAVENOUS | Status: DC | PRN
  Administered 2024-03-07: 50 mg via INTRAVENOUS
  Administered 2024-03-07: 30 mg via INTRAVENOUS
  Administered 2024-03-07: 20 mg via INTRAVENOUS
  Administered 2024-03-07: 125 ug/kg/min via INTRAVENOUS
  Administered 2024-03-07: 40 mg via INTRAVENOUS

## 2024-03-07 MED ORDER — PHENYLEPHRINE 80 MCG/ML (10ML) SYRINGE FOR IV PUSH (FOR BLOOD PRESSURE SUPPORT)
PREFILLED_SYRINGE | INTRAVENOUS | Status: DC | PRN
  Administered 2024-03-07: 80 ug via INTRAVENOUS
  Administered 2024-03-07 (×2): 160 ug via INTRAVENOUS
  Administered 2024-03-07 (×2): 80 ug via INTRAVENOUS

## 2024-03-07 NOTE — H&P (Signed)
 Juan House is an 78 y.o. male.   Chief Complaint: ulcerative colitis HPI: 78 year old male with past medical history of ulcerative colitis, stroke, goiter, Gilbert's syndrome, Barrett's esophagus and asthma, coming for ulcerative colitis.  The patient denies having any nausea, vomiting, fever, chills, hematochezia, melena, hematemesis, abdominal distention, abdominal pain, diarrhea, jaundice, pruritus or weight loss.  Still having some issues with constipation.  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   Enlarged prostate    Gilbert's syndrome    Goiter    Kidney cysts    Kidney stones 2012, 2016   Perforation of bladder 04/04/2017   Stroke (HCC) 08/16/2004   Ulcerative colitis (HCC) 2017    Past Surgical History:  Procedure Laterality Date   CHOLECYSTECTOMY     colon fistula     DRUG INDUCED ENDOSCOPY  11/2021   FLEXIBLE SIGMOIDOSCOPY N/A 09/09/2022   Procedure: FLEXIBLE SIGMOIDOSCOPY;  Surgeon: Eartha Angelia Sieving, MD;  Location: [facility];  Service: Gastroenterology;  Laterality: N/A;  100pm, asa 3   HERNIA REPAIR Bilateral 1992   HERNIA REPAIR  03/2013   hernia, left side along with sperm cord lesion - Dr. Lamar Boos, [place]   NASAL SINUS SURGERY Right 02/17/2022   growth right nasal passage   PROSTATE BIOPSY  2012   sigmodscopy  11/2021    Family History  Problem Relation Age of Onset   Cancer - Colon Mother    Pancreatic cancer Mother    Social History:  reports that he quit smoking about 19 years ago. His smoking use included cigarettes. He started smoking about 54 years ago. He has a 35 pack-year smoking history. He has been exposed to tobacco smoke. He has never used smokeless tobacco. He reports that he does not drink alcohol and does not use drugs.  Allergies:  Allergies  Allergen Reactions   Sulfasalazine     Flu like symptoms, nausea, vomiting    Medications Prior to Admission   Medication Sig Dispense Refill   albuterol  (VENTOLIN  HFA) 108 (90 Base) MCG/ACT inhaler Inhale 2 puffs into the lungs every 6 (six) hours as needed for wheezing or shortness of breath. 8 g 5   Aspirin 81 MG CAPS Take 1 capsule every day by oral route.     Azelastine HCl 137 MCG/SPRAY SOLN Place 1 spray into both nostrils 2 (two) times daily.     Ergocalciferol  50 MCG (2000 UT) TABS Take 1 tablet by mouth daily. 90 tablet 3   fluticasone -salmeterol (ADVAIR ) 250-50 MCG/ACT AEPB Inhale 1 puff into the lungs every 12 (twelve) hours. 60 each 11   folic acid (FOLVITE) 1 MG tablet Take 1 mg by mouth 2 (two) times daily.     latanoprost (XALATAN) 0.005 % ophthalmic solution Place 1 drop into both eyes at bedtime.     montelukast  (SINGULAIR ) 10 MG tablet Take 1 tablet (10 mg total) by mouth at bedtime. TAKE 1 TABLET BY MOUTH AT BEDTIME 90 tablet 1   omeprazole (PRILOSEC) 20 MG capsule Take 20 mg by mouth 2 (two) times daily before a meal.     Probiotic CAPS Take 1 capsule by mouth daily.     rOPINIRole (REQUIP) 0.25 MG tablet Take 1 mg by mouth 3 (three) times daily.     tamsulosin (FLOMAX) 0.4 MG CAPS capsule Take 0.4 mg by mouth 2 (two) times daily before lunch and supper.     vedolizumab  (ENTYVIO ) 300  MG injection Inject into the vein.      No results found for this or any previous visit (from the past 48 hours). No results found.  Review of Systems  All other systems reviewed and are negative.   Blood pressure (!) 160/79, pulse 72, temperature 97.7 F (36.5 C), temperature source Oral, resp. rate 16, height 5' 11 (1.803 m), weight 65.8 kg, SpO2 100%. Physical Exam  GENERAL: The patient is AO x3, in no acute distress. HEENT: Head is normocephalic and atraumatic. EOMI are intact. Mouth is well hydrated and without lesions. NECK: Supple. No masses LUNGS: Clear to auscultation. No presence of rhonchi/wheezing/rales. Adequate chest expansion HEART: RRR, normal s1 and s2. ABDOMEN: Soft,  nontender, no guarding, no peritoneal signs, and nondistended. BS +. No masses. EXTREMITIES: Without any cyanosis, clubbing, rash, lesions or edema. NEUROLOGIC: AOx3, no focal motor deficit. SKIN: no jaundice, no rashes  Assessment/Plan 78 year old male with past medical history of ulcerative colitis, stroke, goiter, Gilbert's syndrome, Barrett's esophagus and asthma, coming for ulcerative colitis.  Will proceed with colonoscopy.  Toribio Eartha Flavors, MD 03/07/2024, 8:03 AM

## 2024-03-07 NOTE — Op Note (Signed)
 [facility] Patient Name: Juan House Procedure Date: 03/07/2024 8:07 AM MRN: [id number] Date of Birth: Jan 11, 1946 Attending MD: Toribio Fortune , , [id number] CSN: [id number] Age: 78 Admit Type: Outpatient Procedure:                Colonoscopy Indications:              Assess therapeutic response to therapy of chronic                            ulcerative proctosigmoiditis Providers:                Toribio Fortune, Devere Lodge, Jon Loge Referring MD:              Medicines:                Monitored Anesthesia Care Complications:            No immediate complications. Estimated Blood Loss:     Estimated blood loss: none. Procedure:                Pre-Anesthesia Assessment:                           - Prior to the procedure, a History and Physical                            was performed, and patient medications, allergies                            and sensitivities were reviewed. The patient's                            tolerance of previous anesthesia was reviewed.                           - The risks and benefits of the procedure and the                            sedation options and risks were discussed with the                            patient. All questions were answered and informed                            consent was obtained.                           - ASA Grade Assessment: II - A patient with mild                            systemic disease.                           After obtaining informed consent, the colonoscope                            was passed under direct vision. Throughout the  procedure, the patient's blood pressure, pulse, and                            oxygen saturations were monitored continuously. The                            [id number]-HQ190L ([id number]) scope was introduced through                            the anus and advanced to the the terminal ileum.                            The colonoscopy was  technically difficult and                            complex due to inadequate bowel prep. Successful                            completion of the procedure was aided by lavage.                            The patient tolerated the procedure well. The                            quality of the bowel preparation was adequate to                            identify polyps greater than 5 mm in size. Scope In: 8:45:35 AM Scope Out: 9:20:45 AM Scope Withdrawal Time: 0 hours 16 minutes 24 seconds  Total Procedure Duration: 0 hours 35 minutes 10 seconds  Findings:      The perianal and digital rectal examinations were normal.      The terminal ileum appeared normal.      Inflammation was not found based on the endoscopic appearance of the       mucosa in the colon. This was graded as Mayo Score 0 (normal or inactive       disease), and when compared to the previous examination, the findings       are in remission. Biopsies were taken from the rectum with a cold       forceps for histology.      Multiple large-mouthed and medium-mouthed diverticula were found in the       sigmoid colon, descending colon and ascending colon.      There was evidence of a prior end-to-side colo-colonic anastomosis in       the sigmoid colon. This was patent and was characterized by healthy       appearing mucosa.      A moderate amount of stool was found in the rectum and in the sigmoid       colon, interfering with visualization. Stool was manually removed -       disimpacted.      The retroflexed view of the distal rectum and anal verge was normal and       showed no anal or rectal abnormalities. Impression:               -  The examined portion of the ileum was normal.                           - Inactive (Mayo Score 0) ulcerative colitis, in                            remission since the last examination. Biopsied.                           - Diverticulosis in the sigmoid colon, in the                             descending colon and in the ascending colon.                           - Patent end-to-side colo-colonic anastomosis,                            characterized by healthy appearing mucosa.                           - Stool in the rectum and in the sigmoid colon.                            Removed manually.                           - The distal rectum and anal verge are normal on                            retroflexion view. Moderate Sedation:      Per Anesthesia Care Recommendation:           - Discharge patient to home (ambulatory).                           - Resume previous diet.                           - Repeat colonoscopy in 5 years for surveillance.                           - Start taking one capful of Miralax every day,                            uptitrate up to 3 capfuls per day as needed                           - Continue Entyvio  every 8 weeks.                           - Await pathology results. 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 All rights reserved. The codes documented in this report are preliminary and upon coder review may  be revised to meet current compliance requirements. Toribio Fortune, MD Toribio Fortune,  03/07/2024 9:29:46 AM This report has been signed electronically. Number of Addenda: 0

## 2024-03-07 NOTE — Anesthesia Preprocedure Evaluation (Signed)
 Anesthesia Evaluation  Patient identified by MRN, date of birth, ID band Patient awake    Reviewed: Allergy  & Precautions, H&P , NPO status , Patient's Chart, lab work & pertinent test results, reviewed documented beta blocker date and time   Airway Mallampati: II  TM Distance: >3 FB Neck ROM: full    Dental no notable dental hx.    Pulmonary asthma , former smoker   Pulmonary exam normal breath sounds clear to auscultation       Cardiovascular Exercise Tolerance: Good hypertension, negative cardio ROS  Rhythm:regular Rate:Normal     Neuro/Psych TIACVA  negative psych ROS   GI/Hepatic Neg liver ROS, PUD,,,  Endo/Other  negative endocrine ROS    Renal/GU Renal disease  negative genitourinary   Musculoskeletal   Abdominal   Peds  Hematology negative hematology ROS (+)   Anesthesia Other Findings   Reproductive/Obstetrics negative OB ROS                              Anesthesia Physical Anesthesia Plan  ASA: 3  Anesthesia Plan: General   Post-op Pain Management:    Induction:   PONV Risk Score and Plan: Propofol  infusion  Airway Management Planned:   Additional Equipment:   Intra-op Plan:   Post-operative Plan:   Informed Consent: I have reviewed the patients History and Physical, chart, labs and discussed the procedure including the risks, benefits and alternatives for the proposed anesthesia with the patient or authorized representative who has indicated his/her understanding and acceptance.     Dental Advisory Given  Plan Discussed with: CRNA  Anesthesia Plan Comments:         Anesthesia Quick Evaluation

## 2024-03-07 NOTE — Transfer of Care (Signed)
 Immediate Anesthesia Transfer of Care Note  Patient: Juan House  Procedure(s) Performed: COLONOSCOPY  Patient Location: Endoscopy Unit  Anesthesia Type:General  Level of Consciousness: drowsy and patient cooperative  Airway & Oxygen Therapy: Patient Spontanous Breathing  Post-op Assessment: Report given to RN and Post -op Vital signs reviewed and stable  Post vital signs: Reviewed and stable  Last Vitals:  Vitals Value Taken Time  BP 142/48 03/07/24 09:26  Temp 36.3 C 03/07/24 09:26  Pulse 64 03/07/24 09:26  Resp    SpO2 98 % 03/07/24 09:26    Last Pain:  Vitals:   03/07/24 0926  TempSrc: Axillary  PainSc:       Patients Stated Pain Goal: 4 (03/07/24 0718)  Complications: No notable events documented.

## 2024-03-07 NOTE — Discharge Instructions (Addendum)
 You are being discharged to home.  Resume your previous diet.  Your physician has recommended a repeat colonoscopy in five years for surveillance.  Continue Entyvio  every 8 weeks. Start taking one capful of Miralax every day, uptitrate up to 3 capfuls per day as needed We are waiting for your pathology results.

## 2024-03-08 ENCOUNTER — Encounter (INDEPENDENT_AMBULATORY_CARE_PROVIDER_SITE_OTHER): Payer: Self-pay | Admitting: *Deleted

## 2024-03-08 ENCOUNTER — Encounter (HOSPITAL_COMMUNITY): Payer: Self-pay | Admitting: Gastroenterology

## 2024-03-08 LAB — SURGICAL PATHOLOGY

## 2024-03-09 ENCOUNTER — Ambulatory Visit (INDEPENDENT_AMBULATORY_CARE_PROVIDER_SITE_OTHER): Payer: Self-pay | Admitting: Gastroenterology

## 2024-03-09 NOTE — Progress Notes (Signed)
 5 yr TCS noted in recall Patient result letter mailed procedure note and pathology result faxed to PCP

## 2024-03-10 NOTE — Anesthesia Postprocedure Evaluation (Signed)
 Anesthesia Post Note  Patient: Juan House  Procedure(s) Performed: COLONOSCOPY  Patient location during evaluation: Phase II Anesthesia Type: General Level of consciousness: awake Pain management: pain level controlled Vital Signs Assessment: post-procedure vital signs reviewed and stable Respiratory status: spontaneous breathing and respiratory function stable Cardiovascular status: blood pressure returned to baseline and stable Postop Assessment: no headache and no apparent nausea or vomiting Anesthetic complications: no Comments: Late entry   No notable events documented.   Last Vitals:  Vitals:   03/07/24 0926 03/07/24 0931  BP: (!) 142/48 (!) 117/45  Pulse: 64 73  Resp:  15  Temp: (!) 36.3 C   SpO2: 98% 100%    Last Pain:  Vitals:   03/07/24 0931  TempSrc:   PainSc: 0-No pain                 Yvonna JINNY Bosworth

## 2024-03-12 ENCOUNTER — Encounter: Payer: Self-pay | Admitting: Internal Medicine

## 2024-03-12 ENCOUNTER — Other Ambulatory Visit: Payer: Self-pay | Admitting: Internal Medicine

## 2024-03-13 ENCOUNTER — Other Ambulatory Visit: Payer: Self-pay

## 2024-03-13 MED ORDER — MONTELUKAST SODIUM 10 MG PO TABS: 10.0000 mg | ORAL_TABLET | Freq: Every day | ORAL | 3 refills | Status: DC

## 2024-03-14 ENCOUNTER — Other Ambulatory Visit (HOSPITAL_COMMUNITY)
Admission: RE | Admit: 2024-03-14 | Discharge: 2024-03-14 | Disposition: A | Discharge status: Home / Self Care | Payer: Self-pay | Source: Ambulatory Visit | Attending: Oncology | Admitting: Oncology

## 2024-03-24 LAB — GENECONNECT MOLECULAR SCREEN: Genetic Analysis Overall Interpretation: NEGATIVE

## 2024-04-18 ENCOUNTER — Encounter: Attending: Gastroenterology | Admitting: Emergency Medicine

## 2024-04-18 VITALS — BP 125/70 | HR 53 | Temp 97.4°F | Resp 16

## 2024-04-18 DIAGNOSIS — K51919 Ulcerative colitis, unspecified with unspecified complications: Secondary | ICD-10-CM | POA: Diagnosis present

## 2024-04-18 MED ORDER — VEDOLIZUMAB 300 MG IV SOLR
300.0000 mg | Freq: Once | INTRAVENOUS | Status: AC
  Administered 2024-04-18: 300 mg via INTRAVENOUS
  Filled 2024-04-18: qty 5

## 2024-04-18 NOTE — Progress Notes (Signed)
 Diagnosis: Ulcerative Colitis  Provider:  Eartha Sieving MD  Procedure: IV Infusion  IV Type: Peripheral, IV Location: R Antecubital  Entyvio  (Vedolizumab ), Dose: 300 mg  Infusion Start Time: 1308  Infusion Stop Time: 1340  Post Infusion IV Care: Peripheral IV Discontinued  Discharge: Condition: Good, Destination: Home . AVS Provided  Performed by:  Delon ONEIDA Officer, RN

## 2024-04-23 ENCOUNTER — Encounter (INDEPENDENT_AMBULATORY_CARE_PROVIDER_SITE_OTHER): Payer: Self-pay | Admitting: Gastroenterology

## 2024-04-29 NOTE — Progress Notes (Signed)
 Subjective:   Patient ID: Juan House, male    DOB: 03/06/1946    MRN: [id number]    Brief patient profile:  44 yowm quit smoking 2006 and grew up in house full of smokers with tendency  Ear  infections as child never required surgery but as an adult started having problems with nasal congestion/ recurrent sinus infections in his 40s and required sinus surgery around 2013 and some better but then fairly soon after sinus surgery noted  more freq lower respiratory symptoms > recurrent cough/ pna 2016 > [place] eval with CT 09/26/14 showed only emphysema rec prn proair  but found pt didn't really need it and did fine until Feb 2019 when started out with cold  Then sob /cough better with proair  and rx with prednisone /abx back to nl then same symptoms in May 2019 then again [facility] June 2019 abx/ prednisone all better but referred 02/13/2018 by Dr Garnette Pond for ? Why recurrent bronchitis.   History of Present Illness  02/13/2018 1st [facility] office visit/ [name]  s present complaints  Chief Complaint  Patient presents with   Pulmonary Consult    referred here by Dr. Garnette Pond, recurrent bronchitis  last proair  2 weeks prior to OV   Last pred x June 22   and last doxy was February 06 2018 and feels fine right now Has 2 story house, walks a mile and a half every day, some doe x hills sats never lower than 88% rec Plan A = Automatic = symbicort  80 Take 2 puffs first thing in am and then another 2 puffs about 12 hours later.  Work on inhaler technique:  Plan B = Backup Only use your albuterol  as a rescue medication   05/02/2023 yearly  f/u ov/[name]/[name] re: cough variant asthma  maint on advair  115  / singulair   Chief Complaint  Patient presents with   Cough variant asthma  Dyspnea:  walking 30 min daily including hills  Cough: none  Sleeping: lie flat  fine / one pillow s  resp cc  SABA use: none  02: none  Rec No change in medications  Please schedule a follow up  visit in 12  months but call sooner if needed     05/02/2024  f/u ov/[place] office/[name] re: cough variant asthma  maint on advair  250 bid  Chief Complaint  Patient presents with   Shortness of Breath    1 yr f/u  Dyspnea:  30 min daily with hills Not limited by breathing from desired activities   Cough: none  Sleeping: lie flat fine one pillow s  resp cc  SABA use: none  02: none   Lung cancer screening: per PCP (now out 19 y from smoking)    No obvious day to day or daytime variability or assoc excess/ purulent sputum or mucus plugs or hemoptysis or cp or chest tightness, subjective wheeze or overt sinus or hb symptoms.    Also denies any obvious fluctuation of symptoms with weather or environmental changes or other aggravating or alleviating factors except as outlined above   No unusual exposure hx or h/o childhood pna/ asthma or knowledge of premature birth.  Current Allergies, Complete Past Medical History, Past Surgical History, Family History, and Social History were reviewed in [name].  ROS  The following are not active complaints unless bolded Hoarseness, sore throat, dysphagia, dental problems, itching, sneezing,  nasal congestion or discharge of excess mucus or purulent  secretions, ear ache,   fever, chills, sweats, unintended wt loss or wt gain, classically pleuritic or exertional cp,  orthopnea pnd or arm/hand swelling  or leg swelling, presyncope, palpitations, abdominal pain, anorexia, nausea, vomiting, diarrhea  or change in bowel habits or change in bladder habits, change in stools or change in urine, dysuria, hematuria,  rash, arthralgias, visual complaints, headache, numbness, weakness or ataxia or problems with walking or coordination,  change in mood or  memory.        Current Meds  Medication Sig   albuterol  (VENTOLIN  HFA) 108 (90 Base) MCG/ACT inhaler Inhale 2 puffs into the lungs every 6 (six) hours as needed for wheezing or  shortness of breath.   Aspirin 81 MG CAPS Take 1 capsule every day by oral route.   Azelastine HCl 137 MCG/SPRAY SOLN Place 1 spray into both nostrils 2 (two) times daily.   Ergocalciferol  50 MCG (2000 UT) TABS Take 1 tablet by mouth daily.   fluticasone -salmeterol (ADVAIR ) 250-50 MCG/ACT AEPB Inhale 1 puff into the lungs every 12 (twelve) hours.   folic acid (FOLVITE) 1 MG tablet Take 1 mg by mouth 2 (two) times daily.   latanoprost (XALATAN) 0.005 % ophthalmic solution Place 1 drop into both eyes at bedtime.   montelukast  (SINGULAIR ) 10 MG tablet Take 1 tablet (10 mg total) by mouth at bedtime.   omeprazole (PRILOSEC) 20 MG capsule Take 20 mg by mouth 2 (two) times daily before a meal.   Probiotic CAPS Take 1 capsule by mouth daily.   rOPINIRole (REQUIP) 0.25 MG tablet Take 1 mg by mouth 3 (three) times daily.   tamsulosin (FLOMAX) 0.4 MG CAPS capsule Take 0.4 mg by mouth 2 (two) times daily before lunch and supper.   vedolizumab  (ENTYVIO ) 300 MG injection Inject into the vein.                     Objective:   Physical Exam  Wts  05/02/2024       153  05/02/2023       144  04/28/2022       151 05/04/2021       144  03/24/2020         150   02/20/2019        154   06/20/18 160 lb (72.6 kg)  03/20/18 160 lb (72.6 kg)  02/13/18 161 lb 3.2 oz (73.1 kg)     .Vital signs reviewed  05/02/2024  - Note at rest 02 sats  99% on RA   General appearance:    amb pleasant     HEENT : Oropharynx  clear   Nasal turbinates nl    NECK :  without  apparent JVD/ palpable Nodes/TM    LUNGS: no acc muscle use,  Min barrel  contour chest wall with bilateral  slightly decreased bs s audible wheeze and  without cough on insp or exp maneuvers and min  Hyperresonant  to  percussion bilaterally    CV:  RRR  no s3 or murmur or increase in P2, and no edema   ABD:  soft and nontender    MS:  Nl gait/ ext warm without deformities Or obvious joint restrictions  calf tenderness, cyanosis or clubbing      SKIN: warm and dry without lesions    NEURO:  alert, approp, nl sensorium with  no motor or cerebellar deficits apparent.           I personally reviewed images  and agree with radiology impression as follows:   Chest CT w/o contrast   02/15/24  Bronchial wall thickening , bronchiectasis and emphysema. Right apical scarring with varicoid bronchiectasis and 6 mm stable reticular RLL density      Assessment & Plan:   Assessment & Plan Cough variant asthma FENO 02/13/2018  =   294 on no rx  - Spirometry 02/13/2018  FEV1 3.29 (99%)  Ratio 80 on no rx  - Allergy  profile 02/13/2018 >  Eos 0.4 /  IgE  1745  Grass, trees, dust  - 02/13/2018  After extensive coaching inhaler device  effectiveness =    90% > try symbicort  80 2bid  - Sinus CT 03/20/2018 >>>  1. Diffuse opacification of anterior and posterior ethmoid air cells and bilateral frontal sinuses. 2. Moderate mucosal thickening involving the dependent maxillary and ethmoid air cells bilaterally - FENO 03/20/2018  =   98 on 80 bid > increased to 160 2bid but changed on his own back to 80 - 02/20/2019    increased back to symb 160 2bid at pt request  - 03/24/2020  great control of symptoms so rec continue symb 160/singulair  and f/u yearly   - 05/04/2021  After extensive coaching inhaler device,  effectiveness =    90%   All goals of chronic asthma control met including optimal function and elimination of symptoms with minimal need for rescue therapy.  Contingencies discussed in full including contacting this office immediately if not controlling the symptoms using the rule of two's.     Since has bronchiectasis want to minimize the ICS component of his treatment if possible so try advair  100 one bid   Bronchiectasis without complication (HCC) See CT chest [facility] 02/15/24  - reduced advair  form 250 to 100 05/02/2024 >>>   Reviewed pathophysiology and complications/ treatments for dx of bronchiectasis using the escalator analogy > no change in rx  other than reduce ICS dose if tolerates  F/u can be yearly,  sooner prn.         Each maintenance medication was reviewed in detail including emphasizing most importantly the difference between maintenance and prns and under what circumstances the prns are to be triggered using an action plan format where appropriate.  Total time for H and P, chart review, counseling, reviewing hfa/ dpi  device(s) and generating customized AVS unique to this office visit / same day charting = 32 min          AVS  Patient Instructions  Try change advair  to the 100 strength and take it twice daily   Bronchiectasis =   you have scarring of your bronchial tubes which means that they don't function perfectly normally and mucus tends to pool in certain areas of your lung which can cause pneumonia and further scarring of your lung and bronchial tubes  Whenever you develop cough congestion take mucinex or mucinex dm > these will help keep the mucus loose and flowing but if your condition worsens you need to seek help immediately preferably here or somewhere inside the [facility] system to compare xrays ( worse = darker or bloody mucus or pain on breathing in)      Follow up is every 12 months, sooner if needed    Ozell America, MD 05/02/2024

## 2024-05-02 ENCOUNTER — Ambulatory Visit: Admitting: Internal Medicine

## 2024-05-02 ENCOUNTER — Encounter: Payer: Self-pay | Admitting: Internal Medicine

## 2024-05-02 VITALS — BP 138/64 | HR 49 | Ht 71.0 in | Wt 153.0 lb

## 2024-05-02 DIAGNOSIS — Z87891 Personal history of nicotine dependence: Secondary | ICD-10-CM | POA: Diagnosis not present

## 2024-05-02 DIAGNOSIS — J45991 Cough variant asthma: Secondary | ICD-10-CM | POA: Diagnosis not present

## 2024-05-02 DIAGNOSIS — J479 Bronchiectasis, uncomplicated: Secondary | ICD-10-CM

## 2024-05-02 MED ORDER — FLUTICASONE-SALMETEROL 100-50 MCG/ACT IN AEPB: 1.0000 | INHALATION_SPRAY | Freq: Two times a day (BID) | RESPIRATORY_TRACT | 11 refills | Status: AC

## 2024-05-02 NOTE — Assessment & Plan Note (Addendum)
 FENO 02/13/2018  =   294 on no rx  - Spirometry 02/13/2018  FEV1 3.29 (99%)  Ratio 80 on no rx  - Allergy  profile 02/13/2018 >  Eos 0.4 /  IgE  1745  Grass, trees, dust  - 02/13/2018  After extensive coaching inhaler device  effectiveness =    90% > try symbicort  80 2bid  - Sinus CT 03/20/2018 >>>  1. Diffuse opacification of anterior and posterior ethmoid air cells and bilateral frontal sinuses. 2. Moderate mucosal thickening involving the dependent maxillary and ethmoid air cells bilaterally - FENO 03/20/2018  =   98 on 80 bid > increased to 160 2bid but changed on his own back to 80 - 02/20/2019    increased back to symb 160 2bid at pt request  - 03/24/2020  great control of symptoms so rec continue symb 160/singulair  and f/u yearly   - 05/04/2021  After extensive coaching inhaler device,  effectiveness =    90%   All goals of chronic asthma control met including optimal function and elimination of symptoms with minimal need for rescue therapy.  Contingencies discussed in full including contacting this office immediately if not controlling the symptoms using the rule of two's.     Since has bronchiectasis want to minimize the ICS component of his treatment if possible so try advair  100 one bid

## 2024-05-02 NOTE — Patient Instructions (Addendum)
 Try change advair  to the 100 strength and take it twice daily   Bronchiectasis =   you have scarring of your bronchial tubes which means that they don't function perfectly normally and mucus tends to pool in certain areas of your lung which can cause pneumonia and further scarring of your lung and bronchial tubes  Whenever you develop cough congestion take mucinex or mucinex dm > these will help keep the mucus loose and flowing but if your condition worsens you need to seek help immediately preferably here or somewhere inside the [facility] system to compare xrays ( worse = darker or bloody mucus or pain on breathing in)      Follow up is every 12 months, sooner if needed

## 2024-05-02 NOTE — Assessment & Plan Note (Addendum)
 See CT chest [facility] 02/15/24  - reduced advair  form 250 to 100 05/02/2024 >>>   Reviewed pathophysiology and complications/ treatments for dx of bronchiectasis using the escalator analogy > no change in rx other than reduce ICS dose if tolerates  F/u can be yearly,  sooner prn.         Each maintenance medication was reviewed in detail including emphasizing most importantly the difference between maintenance and prns and under what circumstances the prns are to be triggered using an action plan format where appropriate.  Total time for H and P, chart review, counseling, reviewing hfa/ dpi  device(s) and generating customized AVS unique to this office visit / same day charting = 32 min

## 2024-05-05 ENCOUNTER — Encounter (INDEPENDENT_AMBULATORY_CARE_PROVIDER_SITE_OTHER): Payer: Self-pay | Admitting: Gastroenterology

## 2024-05-07 ENCOUNTER — Encounter (HOSPITAL_COMMUNITY): Payer: Self-pay | Admitting: Emergency Medicine

## 2024-05-07 ENCOUNTER — Emergency Department (HOSPITAL_COMMUNITY)
Admission: EM | Admit: 2024-05-07 | Discharge: 2024-05-07 | Disposition: A | Discharge status: Home / Self Care | Source: Ambulatory Visit | Attending: Emergency Medicine | Admitting: Emergency Medicine

## 2024-05-07 ENCOUNTER — Telehealth (INDEPENDENT_AMBULATORY_CARE_PROVIDER_SITE_OTHER): Payer: Self-pay | Admitting: *Deleted

## 2024-05-07 ENCOUNTER — Encounter (INDEPENDENT_AMBULATORY_CARE_PROVIDER_SITE_OTHER): Payer: Self-pay

## 2024-05-07 ENCOUNTER — Other Ambulatory Visit: Payer: Self-pay

## 2024-05-07 ENCOUNTER — Telehealth (INDEPENDENT_AMBULATORY_CARE_PROVIDER_SITE_OTHER): Payer: Self-pay

## 2024-05-07 ENCOUNTER — Other Ambulatory Visit (INDEPENDENT_AMBULATORY_CARE_PROVIDER_SITE_OTHER): Payer: Self-pay

## 2024-05-07 DIAGNOSIS — K529 Noninfective gastroenteritis and colitis, unspecified: Secondary | ICD-10-CM | POA: Insufficient documentation

## 2024-05-07 DIAGNOSIS — E860 Dehydration: Secondary | ICD-10-CM | POA: Diagnosis not present

## 2024-05-07 DIAGNOSIS — Z7982 Long term (current) use of aspirin: Secondary | ICD-10-CM | POA: Insufficient documentation

## 2024-05-07 DIAGNOSIS — R197 Diarrhea, unspecified: Secondary | ICD-10-CM | POA: Diagnosis present

## 2024-05-07 LAB — CBC
HCT: 43.6 % (ref 39.0–52.0)
Hemoglobin: 14.4 g/dL (ref 13.0–17.0)
MCH: 29.3 pg (ref 26.0–34.0)
MCHC: 33 g/dL (ref 30.0–36.0)
MCV: 88.8 fL (ref 80.0–100.0)
Platelets: 186 K/uL (ref 150–400)
RBC: 4.91 MIL/uL (ref 4.22–5.81)
RDW: 13.2 % (ref 11.5–15.5)
WBC: 5.5 K/uL (ref 4.0–10.5)
nRBC: 0 % (ref 0.0–0.2)

## 2024-05-07 LAB — URINALYSIS, ROUTINE W REFLEX MICROSCOPIC
Bacteria, UA: NONE SEEN
Bilirubin Urine: NEGATIVE
Glucose, UA: NEGATIVE mg/dL
Ketones, ur: 5 mg/dL — AB
Leukocytes,Ua: NEGATIVE
Nitrite: NEGATIVE
Protein, ur: NEGATIVE mg/dL
Specific Gravity, Urine: 1.004 — ABNORMAL LOW (ref 1.005–1.030)
pH: 5 (ref 5.0–8.0)

## 2024-05-07 LAB — COMPREHENSIVE METABOLIC PANEL WITH GFR
ALT: 19 U/L (ref 0–44)
AST: 24 U/L (ref 15–41)
Albumin: 4.3 g/dL (ref 3.5–5.0)
Alkaline Phosphatase: 40 U/L (ref 38–126)
Anion gap: 15 (ref 5–15)
BUN: 14 mg/dL (ref 8–23)
CO2: 24 mmol/L (ref 22–32)
Calcium: 9.1 mg/dL (ref 8.9–10.3)
Chloride: 98 mmol/L (ref 98–111)
Creatinine, Ser: 1.17 mg/dL (ref 0.61–1.24)
GFR, Estimated: >60 mL/min (ref >60)
Glucose, Bld: 87 mg/dL (ref 70–99)
Potassium: 4 mmol/L (ref 3.5–5.1)
Sodium: 137 mmol/L (ref 135–145)
Total Bilirubin: 3.3 mg/dL — ABNORMAL HIGH (ref 0.0–1.2)
Total Protein: 7.1 g/dL (ref 6.5–8.1)

## 2024-05-07 LAB — LIPASE, BLOOD: Lipase: 28 U/L (ref 11–51)

## 2024-05-07 MED ORDER — SODIUM CHLORIDE 0.9 % IV BOLUS
1000.0000 mL | Freq: Once | INTRAVENOUS | Status: AC
  Administered 2024-05-07: 1000 mL via INTRAVENOUS

## 2024-05-07 MED ORDER — ONDANSETRON HCL 4 MG/2ML IJ SOLN
4.0000 mg | Freq: Once | INTRAMUSCULAR | Status: AC
  Administered 2024-05-07: 4 mg via INTRAVENOUS
  Filled 2024-05-07: qty 2

## 2024-05-07 MED ORDER — ONDANSETRON HCL 4 MG PO TABS: 4.0000 mg | ORAL_TABLET | Freq: Four times a day (QID) | ORAL | 0 refills | Status: DC

## 2024-05-07 NOTE — Telephone Encounter (Signed)
 Thanks

## 2024-05-07 NOTE — Telephone Encounter (Signed)
 I called the patient to let him know that Dr. Eartha want him to have two tests done at [facility] a Gi path and a Cdiff. Patient states understanding and says he got the message from Dr. Eartha, and he feels he needs to be seen in the Ed. I advised that if he felt he needed to to go to the nearest Ed. Patient says he will start making his way down to [facility] Ed.

## 2024-05-07 NOTE — Telephone Encounter (Signed)
 Thanks for the update

## 2024-05-07 NOTE — Telephone Encounter (Signed)
 Spoke to patient - he is on his way  to [facility] ED - wanting yall to know

## 2024-05-07 NOTE — Discharge Instructions (Signed)
 Your testing has been very reassuring showing normal blood work, your urine sample showed only mild dehydration, your vital signs have been normal, we have given you IV fluids and I have refilled your Zofran .  Return to the ER for severe worsening symptoms, you can start taking some Imodium as needed if the diarrhea continues  Most of the time diarrhea will go away by itself with time.  If you have bloody diarrhea or severe diarrhea you will need to be seen in the emergency department.  Drink plenty of clear liquids small amounts at a time to help stay hydrated.  If your diarrhea last for more than 72 hours you may take a medicine called Imodium, this is found over-the-counter at your local pharmacy.  Imodium is taken 2 capsules right away then 1 capsule with every bowel movement, maximum of 8 capsules in a day.

## 2024-05-07 NOTE — ED Provider Notes (Signed)
 [place] EMERGENCY DEPARTMENT AT [facility] Provider Note   CSN: [id number] Arrival date & time: 05/07/24  1439     Patient presents with: Diarrhea   Juan House is a 78 y.o. male.    Diarrhea This patient is a very pleasant 78 year old male with a history of ulcerative colitis, he is currently on medication for that chronically in the form of Entyvio , he has done very well with that over the last year but over the last 3 or 4 days he has developed watery diarrhea which is persistent, does not seem to be getting any better with time, today it seemed to get better and he has not yet had a bowel movement but continues to be nauseated.  His intake has been primarily Gatorade and water, he discussed his care with his gastroenterologist by secure chat today and was told to come in for evaluation of being dehydrated.  The patient denies fevers and he denies abdominal pain.  He has no blood in his stools, he has not been traveling and has not been exposed to anybody who has been sick nor has he had recent antibiotics     Prior to Admission medications   Medication Sig Start Date End Date Taking? Authorizing Provider  ondansetron  (ZOFRAN ) 4 MG tablet Take 1 tablet (4 mg total) by mouth every 6 (six) hours. 05/07/24  Yes Cleotilde Rogue, MD  albuterol  (VENTOLIN  HFA) 108 [phone] Base) MCG/ACT inhaler Inhale 2 puffs into the lungs every 6 (six) hours as needed for wheezing or shortness of breath. 05/18/22   Darlean Ozell NOVAK, MD  Aspirin 81 MG CAPS Take 1 capsule every day by oral route.    [provider]  Azelastine HCl 137 MCG/SPRAY SOLN Place 1 spray into both nostrils 2 (two) times daily. 01/06/22   [provider]  Ergocalciferol  50 MCG (2000 UT) TABS Take 1 tablet by mouth daily. 08/11/23   Eartha Angelia Sieving, MD  fluticasone -salmeterol (ADVAIR ) 100-50 MCG/ACT AEPB Inhale 1 puff into the lungs 2 (two) times daily. 05/02/24   Darlean Ozell NOVAK, MD  folic acid  (FOLVITE) 1 MG tablet Take 1 mg by mouth 2 (two) times daily. 10/28/16   [provider]  latanoprost (XALATAN) 0.005 % ophthalmic solution Place 1 drop into both eyes at bedtime. 03/04/22   [provider]  montelukast  (SINGULAIR ) 10 MG tablet Take 1 tablet (10 mg total) by mouth at bedtime. 03/13/24   Darlean Ozell NOVAK, MD  omeprazole (PRILOSEC) 20 MG capsule Take 20 mg by mouth 2 (two) times daily before a meal.    [provider]  Probiotic CAPS Take 1 capsule by mouth daily.    [provider]  rOPINIRole (REQUIP) 0.25 MG tablet Take 1 mg by mouth 3 (three) times daily.    [provider]  tamsulosin (FLOMAX) 0.4 MG CAPS capsule Take 0.4 mg by mouth 2 (two) times daily before lunch and supper. 10/06/15   [provider]  vedolizumab  (ENTYVIO ) 300 MG injection Inject into the vein.    [provider]    Allergies: Sulfasalazine    Review of Systems  Gastrointestinal:  Positive for diarrhea.  All other systems reviewed and are negative.   Updated Vital Signs BP (!) 141/78 (BP Location: Right Arm)   Pulse 62   Temp 98.1 F (36.7 C) (Oral)   Resp 18   SpO2 99%   Physical Exam Vitals and nursing note reviewed.  Constitutional:  General: He is not in acute distress.    Appearance: He is well-developed.  HENT:     Head: Normocephalic and atraumatic.     Mouth/Throat:     Mouth: Mucous membranes are dry.     Pharynx: No oropharyngeal exudate.  Eyes:     General: No scleral icterus.       Right eye: No discharge.        Left eye: No discharge.     Conjunctiva/sclera: Conjunctivae normal.     Pupils: Pupils are equal, round, and reactive to light.  Neck:     Thyroid: No thyromegaly.     Vascular: No JVD.  Cardiovascular:     Rate and Rhythm: Normal rate and regular rhythm.     Heart sounds: Normal heart sounds. No murmur heard.    No friction rub. No gallop.  Pulmonary:     Effort: Pulmonary effort is normal. No  respiratory distress.     Breath sounds: Normal breath sounds. No wheezing or rales.  Abdominal:     General: Bowel sounds are normal. There is no distension.     Palpations: Abdomen is soft. There is no mass.     Tenderness: There is no abdominal tenderness.     Comments: Normal bowel sounds, abdomen is diffusely soft and nontender  Musculoskeletal:        General: No tenderness. Normal range of motion.     Cervical back: Normal range of motion and neck supple.     Right lower leg: No edema.     Left lower leg: No edema.  Lymphadenopathy:     Cervical: No cervical adenopathy.  Skin:    General: Skin is warm and dry.     Findings: No erythema or rash.  Neurological:     Mental Status: He is alert.     Coordination: Coordination normal.  Psychiatric:        Behavior: Behavior normal.     (all labs ordered are listed, but only abnormal results are displayed) Labs Reviewed  COMPREHENSIVE METABOLIC PANEL WITH GFR - Abnormal; Notable for the following components:      Result Value   Total Bilirubin 3.3 (*)    All other components within normal limits  URINALYSIS, ROUTINE W REFLEX MICROSCOPIC - Abnormal; Notable for the following components:   Color, Urine STRAW (*)    Specific Gravity, Urine 1.004 (*)    Hgb urine dipstick SMALL (*)    Ketones, ur 5 (*)    All other components within normal limits  LIPASE, BLOOD  CBC    EKG: None  Radiology: No results found.   Procedures   Medications Ordered in the ED  sodium chloride  0.9 % bolus 1,000 mL (1,000 mLs Intravenous New Bag/Given 05/07/24 1640)  ondansetron  (ZOFRAN ) injection 4 mg (4 mg Intravenous Given 05/07/24 1640)                                    Medical Decision Making Amount and/or Complexity of Data Reviewed Labs: ordered.  Risk Prescription drug management.    This patient presents to the ED for concern of possible dehydration differential diagnosis includes gastroenteritis, persistent diarrhea,  ulcerative colitis flare, seems less likely to be bacterial given that it is improving and there is no abdominal tenderness or blood in the stools    Additional history obtained   Additional history obtained from Electronic Medical Record  External records from outside source obtained and reviewed including medical record including prior visits to gastroenterology   Lab Tests:  I Ordered, and personally interpreted labs.  The pertinent results include: CBC unremarkable, CMP unremarkable, urinalysis with only mild ketones   Imaging Studies ordered:  Without tenderness fever or tachycardia or leukocytosis CT scan is not indicated   Medicines ordered and prescription drug management:  I ordered medication including Zofran  and IV fluids    I have reviewed the patients home medicines and have made adjustments as needed   Problem List / ED Course:  Improved, tolerating p.o., stable for discharge   Social Determinants of Health:  Sort of colitis        Final diagnoses:  Dehydration  Gastroenteritis    ED Discharge Orders          Ordered    ondansetron  (ZOFRAN ) 4 MG tablet  Every 6 hours        05/07/24 1713               Cleotilde Rogue, MD 05/07/24 1714

## 2024-05-07 NOTE — ED Triage Notes (Addendum)
 Pt c/o of dairrhea, weakness and dry mouth x5 days. Pt states GI doctor sent him in for possible dehydration. Denies any other symptoms.

## 2024-05-08 ENCOUNTER — Telehealth (INDEPENDENT_AMBULATORY_CARE_PROVIDER_SITE_OTHER): Payer: Self-pay

## 2024-05-08 NOTE — Telephone Encounter (Signed)
 Patient called this morning stating he did go to [facility] Ed yesterday and was diagnosed with gastroenteritis. He was given fluids and had some labs drawn, and a urine sample was collected. Patient says he did not have any issue with diarrhea yesterday, but he has already had four loose Bm's this morning. Patient is currently as we spoke on the phone at [facility] dropping off his stool samples. He says they advised the testing results would not be back until 3-4 days, Patient denies any fever, abdominal pain, Vomiting, dark stools or sight of bright red blood in stools. The patient has a history of UC and is currently taking Entyvio  Every two months, with the last infusion on 04/18/2024. I advised the patient to stay hydrated,but he wanted to know what he should do now while he waits on the stool testing to come back. Can he take imodium and would like any recommendations. Please advise.

## 2024-05-08 NOTE — Telephone Encounter (Signed)
 Thanks. He should not take Imodium or antidiarrheals until the stool testing is back, as it can make the infection worse.  He needs to keep hydrated with Pedialyte and eating a BRAD diet for the next 7-10 days. We will reach him once the stool testing is back.

## 2024-05-08 NOTE — Telephone Encounter (Signed)
 I spoke with the patient and made him aware per Dr. Eartha,  He should not take Imodium or antidiarrheals until the stool testing is back, as it can make the infection worse.  He needs to keep hydrated with Pedialyte and eating a BRAD diet for the next 7-10 days. We will reach him once the stool testing is back.    Patient states understanding.

## 2024-05-10 ENCOUNTER — Other Ambulatory Visit (INDEPENDENT_AMBULATORY_CARE_PROVIDER_SITE_OTHER): Payer: Self-pay | Admitting: Gastroenterology

## 2024-05-10 ENCOUNTER — Ambulatory Visit (INDEPENDENT_AMBULATORY_CARE_PROVIDER_SITE_OTHER): Payer: Self-pay | Admitting: Gastroenterology

## 2024-05-10 ENCOUNTER — Telehealth (INDEPENDENT_AMBULATORY_CARE_PROVIDER_SITE_OTHER): Payer: Self-pay

## 2024-05-10 DIAGNOSIS — A0472 Enterocolitis due to Clostridium difficile, not specified as recurrent: Secondary | ICD-10-CM | POA: Insufficient documentation

## 2024-05-10 LAB — GI PROFILE, STOOL, PCR

## 2024-05-10 LAB — CLOSTRIDIUM DIFFICILE BY PCR: Toxigenic C. Difficile by PCR: POSITIVE — AB

## 2024-05-10 MED ORDER — VANCOMYCIN HCL 125 MG PO CAPS: 125.0000 mg | ORAL_CAPSULE | Freq: Four times a day (QID) | ORAL | 0 refills | Status: AC

## 2024-05-10 NOTE — Telephone Encounter (Signed)
 Noted, Thanks

## 2024-05-10 NOTE — Telephone Encounter (Signed)
 Patient wanted me to let you know he had c diff in September 2021. He also wanted me to let you know he was treated with an antibiotic in July of this year for the treatment of Lyme disease.

## 2024-05-10 NOTE — Telephone Encounter (Signed)
 I spoke to the patient today, we will send vancomycin  to his pharmacy for total of 2 weeks.

## 2024-05-10 NOTE — Telephone Encounter (Signed)
 Patient calling regarding his stool testing being positive for Cdiff. He says he had Cdiff a few years ago, and he thinks he was treated with Vancomycin . Patient uses [facility] in [place], [place] the one on [address]. Please advise.

## 2024-05-17 ENCOUNTER — Encounter: Payer: Self-pay | Admitting: Gastroenterology

## 2024-05-21 ENCOUNTER — Encounter (INDEPENDENT_AMBULATORY_CARE_PROVIDER_SITE_OTHER): Payer: Self-pay | Admitting: Gastroenterology

## 2024-05-21 ENCOUNTER — Ambulatory Visit (INDEPENDENT_AMBULATORY_CARE_PROVIDER_SITE_OTHER): Admitting: Gastroenterology

## 2024-05-21 ENCOUNTER — Encounter: Payer: Self-pay | Admitting: Gastroenterology

## 2024-05-21 VITALS — BP 148/73 | HR 55 | Temp 97.5°F | Ht 71.0 in | Wt 148.6 lb

## 2024-05-21 DIAGNOSIS — K51919 Ulcerative colitis, unspecified with unspecified complications: Secondary | ICD-10-CM

## 2024-05-21 DIAGNOSIS — K5190 Ulcerative colitis, unspecified, without complications: Secondary | ICD-10-CM | POA: Diagnosis not present

## 2024-05-21 DIAGNOSIS — A0472 Enterocolitis due to Clostridium difficile, not specified as recurrent: Secondary | ICD-10-CM

## 2024-05-21 NOTE — Progress Notes (Signed)
 Toribio Fortune, M.D. Gastroenterology & Hepatology [facility] G[facility]enter[facility] [address] [place], [place] [address]  Primary Care Physician: Diedra Senior, MD [address] [place] [address]  I will communicate my assessment and recommendations to the referring MD via EMR.  Problems: Ulcerative colitis Recent C. difficile infection  History of Present Illness: Juan House is a 78 y.o. male with past medical history of TIA, Ulcerative rectosigmoiditis, recent C. difficile infection and Barrett's esophagus who presents for follow up of ulcerative colitis and C. difficile.  The patient was last seen on 01/10/2024. At that time, the patient was continued on Entyvio  every 8 weeks.  Levels were checked on 02/20/2024, with adequate levels of 20 and negative antibodies..  Colonoscopy was scheduled which showed endoscopic remission.  Unfortunately, the patient presented new onset of diarrhea and abdominal pain on 05/05/2024.  Stool testing was performed on 05/08/24 and came positive for C. Diff. He was prescribed a 2 weeks course of vancomycin .  Notably, he was given amoxicillin on June 2025 for possible Lyme's disease.  Pateitn reports that he is having 2-3 formed Bms per day. Has not needed to use Miralax yet. He will finish vancomycin  on Thursday. He may have nausea just before having a Bm. No abdominal pain, melena, hematochezia. States having a slight burning sensation after having a BM.  The patient denies having any nausea, vomiting, fever, chills, hematemesis, abdominal distention, abdominal pain, jaundice, pruritus or weight loss.  Last flu shot:2024 Last pneumonia shot:yes, had it in the past, unknown when Last evaluation by dermatology: December 2024  Last zoster vaccine: possibly in 2021 COVID-19 shot: Booster in 2024  Last EGD:April 2023, normal duodenum, normal mucosa in esophagus, erythema and edema, marked inflammation in antrum compatible  with non erosive gastritis. ( Stomach bx showed reactive foveolar hyperplasia, stromal fibrosis and focal chronic inflammation, no h pylori, esophagus bx showed chronic inflammation, negative for barrett's or dysplasia, squamous mucosa with reactive epithelial changes)   Last Colonoscopy: 03/07/2024 - The examined portion of the ileum was normal. - Inactive ( Mayo Score 0) ulcerative colitis, in remission since the last examination. Biopsied. - Diverticulosis in the sigmoid colon, in the descending colon and in the ascending colon. - Patent end- to- side colo- colonic anastomosis, characterized by healthy appearing mucosa. - Stool in the rectum and in the sigmoid colon. Removed manually. - The distal rectum and anal verge are normal on retroflexion view.  Advised to repeat colonoscopy in 5 years and to start MiraLAX daily.  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   Enlarged prostate    Gilbert's syndrome    Goiter    Kidney cysts    Kidney stones 2012, 2016   Perforation of bladder 04/04/2017   Stroke (HCC) 08/16/2004   Ulcerative colitis (HCC) 2017    Past Surgical History: Past Surgical History:  Procedure Laterality Date   CHOLECYSTECTOMY     colon fistula     COLONOSCOPY N/A 03/07/2024   Procedure: COLONOSCOPY;  Surgeon: Fortune Angelia Toribio, MD;  Location: [facility] END[facility] SUITE;  Service: Gastroenterology;  Laterality: N/A;  815am, asa 2   DRUG INDUCED ENDOSCOPY  11/2021   FLEXIBLE SIGMOIDOSCOPY N/A 09/09/2022   Procedure: FLEXIBLE SIGMOIDOSCOPY;  Surgeon: Fortune Angelia Toribio, MD;  Location: [facility] ENDO SUITE;  Service: Gastroenterology;  Laterality: N/A;  100pm, asa 3   HERNIA REPAIR Bilateral 1992  HERNIA REPAIR  03/2013   hernia, left side along with sperm cord lesion - Dr. Lamar Boos, [place]   NASAL SINUS SURGERY Right 02/17/2022   growth right nasal passage   PROSTATE BIOPSY  2012    sigmodscopy  11/2021    Family History: Family History  Problem Relation Age of Onset   Cancer - Colon Mother    Pancreatic cancer Mother     Social History: Social History   Tobacco Use  Smoking Status Former   Current packs/day: 0.00   Average packs/day: 1 pack/day for 35.0 years (35.0 ttl pk-yrs)   Types: Cigarettes   Start date: 08/16/1969   Quit date: 08/16/2004   Years since quitting: 19.7   Passive exposure: Past  Smokeless Tobacco Never   Social History   Substance and Sexual Activity  Alcohol Use Never   Social History   Substance and Sexual Activity  Drug Use Never    Allergies: Allergies  Allergen Reactions   Sulfasalazine     Flu like symptoms, nausea, vomiting    Medications: Current Outpatient Medications  Medication Sig Dispense Refill   albuterol  (VENTOLIN  HFA) 108 (90 Base) MCG/ACT inhaler Inhale 2 puffs into the lungs every 6 (six) hours as needed for wheezing or shortness of breath. 8 g 5   Azelastine HCl 137 MCG/SPRAY SOLN Place 1 spray into both nostrils 2 (two) times daily.     Ergocalciferol  50 MCG (2000 UT) TABS Take 1 tablet by mouth daily. 90 tablet 3   fluticasone -salmeterol (ADVAIR ) 100-50 MCG/ACT AEPB Inhale 1 puff into the lungs 2 (two) times daily. 30 each 11   folic acid (FOLVITE) 1 MG tablet Take 1 mg by mouth 2 (two) times daily. (Patient taking differently: Take 1 mg by mouth daily.)     latanoprost (XALATAN) 0.005 % ophthalmic solution Place 1 drop into both eyes at bedtime.     montelukast  (SINGULAIR ) 10 MG tablet Take 1 tablet (10 mg total) by mouth at bedtime. 90 tablet 3   omeprazole (PRILOSEC) 20 MG capsule Take 20 mg by mouth 2 (two) times daily before a meal.     ondansetron  (ZOFRAN ) 4 MG tablet Take 1 tablet (4 mg total) by mouth every 6 (six) hours. 12 tablet 0   Probiotic CAPS Take 1 capsule by mouth daily.     rOPINIRole (REQUIP) 0.25 MG tablet Take 1 mg by mouth 3 (three) times daily. (Patient taking differently: Take  1 mg by mouth at bedtime.)     tamsulosin (FLOMAX) 0.4 MG CAPS capsule Take 0.4 mg by mouth 2 (two) times daily before lunch and supper.     vancomycin  (VANCOCIN ) 125 MG capsule Take 1 capsule (125 mg total) by mouth 4 (four) times daily for 14 days. 56 capsule 0   vedolizumab  (ENTYVIO ) 300 MG injection Inject into the vein.     Aspirin 81 MG CAPS Take 1 capsule every day by oral route. (Patient not taking: Reported on 05/21/2024)     No current facility-administered medications for this visit.    Review of Systems: GENERAL: negative for malaise, night sweats HEENT: No changes in hearing or vision, no nose bleeds or other nasal problems. NECK: Negative for lumps, goiter, pain and significant neck swelling RESPIRATORY: Negative for cough, wheezing CARDIOVASCULAR: Negative for chest pain, leg swelling, palpitations, orthopnea GI: SEE HPI MUSCULOSKELETAL: Negative for joint pain or swelling, back pain, and muscle pain. SKIN: Negative for lesions, rash PSYCH: Negative for sleep disturbance, mood disorder and recent psychosocial  stressors. HEMATOLOGY Negative for prolonged bleeding, bruising easily, and swollen nodes. ENDOCRINE: Negative for cold or heat intolerance, polyuria, polydipsia and goiter. NEURO: negative for tremor, gait imbalance, syncope and seizures. The remainder of the review of systems is noncontributory.   Physical Exam: BP (!) 148/73 (BP Location: Left Arm, Patient Position: Sitting, Cuff Size: Normal)   Pulse (!) 55   Temp (!) 97.5 F (36.4 C) (Temporal)   Ht 5' 11 (1.803 m)   Wt 148 lb 9.6 oz (67.4 kg)   BMI 20.73 kg/m  GENERAL: The patient is AO x3, in no acute distress. HEENT: Head is normocephalic and atraumatic. EOMI are intact. Mouth is well hydrated and without lesions. NECK: Supple. No masses LUNGS: Clear to auscultation. No presence of rhonchi/wheezing/rales. Adequate chest expansion HEART: RRR, normal s1 and s2. ABDOMEN: Soft, nontender, no guarding, no  peritoneal signs, and nondistended. BS +. No masses. EXTREMITIES: Without any cyanosis, clubbing, rash, lesions or edema. NEUROLOGIC: AOx3, no focal motor deficit. SKIN: no jaundice, no rashes  Imaging/Labs: as above  I personally reviewed and interpreted the available labs, imaging and endoscopic files.  Impression and Plan: Juan House is a 78 y.o. male with past medical history of TIA, Ulcerative rectosigmoiditis, recent C. difficile infection and Barrett's esophagus who presents for follow up of ulcerative colitis and C. difficile.  Patient has presented significant improvement of acute gastrointestinal symptoms after starting vancomycin  course for C. difficile.  He has multiple risk factors (ulcerative colitis, history of C. 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  Fortunately, he seems to be responding to the antibiotic course.  He should finish the full antibiotic course for total of 2 weeks.  Patient had recent colonoscopy showing endoscopic remission and adequate response to Entyvio .  His levels are adequate.  We should continue with the same dosage every 8 weeks (next dose in November, which will not interfere with his current C. difficile treatment).  He is up-to-date with his preventative measures but I encouraged him to get his flu shot at the end of October.  -Finish vancomycin  course -Continue Entyvio  every 8 weeks - The patient should  let us  know if presenting persistent rectal pain, rectal bleeding or if diarrhea recurs -Flu shot at the end of October  All questions were answered.      Toribio Fortune, MD Gastroenterology and Hepatology [facility] Gastroenterology

## 2024-05-21 NOTE — Patient Instructions (Addendum)
 Finish vancomycin  course Continue Entyvio  every 8 weeks Please let us  know if presenting persistent rectal pain, rectal bleeding or if diarrhea recurs Please get flu shot at the end of October

## 2024-05-30 ENCOUNTER — Encounter (INDEPENDENT_AMBULATORY_CARE_PROVIDER_SITE_OTHER): Payer: Self-pay | Admitting: Gastroenterology

## 2024-06-19 ENCOUNTER — Encounter: Attending: Gastroenterology | Admitting: Emergency Medicine

## 2024-06-19 VITALS — BP 120/70 | HR 66 | Temp 97.6°F | Resp 18

## 2024-06-19 DIAGNOSIS — K51919 Ulcerative colitis, unspecified with unspecified complications: Secondary | ICD-10-CM | POA: Diagnosis present

## 2024-06-19 MED ORDER — VEDOLIZUMAB 300 MG IV SOLR
300.0000 mg | Freq: Once | INTRAVENOUS | Status: AC
  Administered 2024-06-19: 300 mg via INTRAVENOUS
  Filled 2024-06-19: qty 5

## 2024-06-19 NOTE — Progress Notes (Signed)
 Diagnosis: Ulcerative Colitis  Provider:  Eartha Sieving MD  Procedure: IV Infusion  IV Type: Peripheral, IV Location: L Antecubital  Entyvio  (Vedolizumab ), Dose: 300 mg  Infusion Start Time: 1254  Infusion Stop Time: 1325  Post Infusion IV Care: Peripheral IV Discontinued  Discharge: Condition: Good, Destination: Home . AVS Provided  Performed by:  Delon ONEIDA Officer, RN

## 2024-06-27 ENCOUNTER — Other Ambulatory Visit (INDEPENDENT_AMBULATORY_CARE_PROVIDER_SITE_OTHER): Payer: Self-pay

## 2024-06-27 ENCOUNTER — Encounter (INDEPENDENT_AMBULATORY_CARE_PROVIDER_SITE_OTHER): Payer: Self-pay | Admitting: Gastroenterology

## 2024-06-27 DIAGNOSIS — R197 Diarrhea, unspecified: Secondary | ICD-10-CM

## 2024-06-27 DIAGNOSIS — Z8619 Personal history of other infectious and parasitic diseases: Secondary | ICD-10-CM

## 2024-06-27 NOTE — Telephone Encounter (Signed)
 I spoke with the patient and he denies any fevers, bloody,or dark stool, no mucus in stools, denies abdominal pain,denies foul smelling stools, stools are a normal brown color. He says he finished the Vancomycin  a month ago, and since coming off he noticed he started having more frequent loose stools.He reports the stools are not the watery consistency they were when he had Cdiff. He says Dr. Eartha, had told him in the past he may need a steroid. He says he would like something called in for him to [facility] in [place], [place] on [address] as he has no time to go and collect more stools as he has to leave to go out of town. ( Please see the message from patient below).  Thanks,

## 2024-06-27 NOTE — Telephone Encounter (Signed)
 I spoke with the patient via phone message and made him aware per Mitzie Boettcher, NP, I spoke with Mitzie, she is covering for Dr. Eartha, and I made her aware of your issues. She is hesitant of sending in any steroids as of now, due to your recent C Diff infection. She would like for you to try and get two stool test done tomorrow morning at [facility] if you can. I have placed the orders for these to [facility], please go to the one nearest to you. One test is a fecal calprotectin, to see if your UC is currently flaring up, and the other is to recheck a C Diff to see if the infection has came back. She says she is sorry, but she can not treat this with a steroid, without knowing what she is treating as the steroids could cause further issues, if this is the C diff that has reoccurred. She does not recommend taking any imodium until we get the lab results back. Again, she says she is sorry for the inconvenience.  Patient states understanding.

## 2024-06-28 ENCOUNTER — Other Ambulatory Visit (INDEPENDENT_AMBULATORY_CARE_PROVIDER_SITE_OTHER): Payer: Self-pay

## 2024-06-28 NOTE — Telephone Encounter (Signed)
 Patient called back to report that he did drop off his stool samples to [facility] today. He would like to make it known that he will be staying in [place], [place] for the next week, and if his stool samples come back positive for C diff or elevated Calprotectin he would be using [facility] in [place], [place] on [address], phone # there is [phone] fax # is [phone]. He would says if positive over the week end and we need to reach him, he will have access to his My Chart so we can let him know of any further recommendations.

## 2024-06-28 NOTE — Telephone Encounter (Signed)
Noted. Thanks,

## 2024-07-02 ENCOUNTER — Ambulatory Visit (INDEPENDENT_AMBULATORY_CARE_PROVIDER_SITE_OTHER): Payer: Self-pay | Admitting: Gastroenterology

## 2024-07-02 ENCOUNTER — Telehealth (INDEPENDENT_AMBULATORY_CARE_PROVIDER_SITE_OTHER): Payer: Self-pay

## 2024-07-02 LAB — C DIFFICILE, CYTOTOXIN B

## 2024-07-02 LAB — C DIFFICILE TOXINS A+B W/RFLX: C difficile Toxins A+B, EIA: NEGATIVE

## 2024-07-02 NOTE — Telephone Encounter (Signed)
 Error

## 2024-07-03 ENCOUNTER — Other Ambulatory Visit (INDEPENDENT_AMBULATORY_CARE_PROVIDER_SITE_OTHER): Payer: Self-pay | Admitting: Gastroenterology

## 2024-07-03 LAB — CALPROTECTIN, FECAL: Calprotectin, Fecal: 221 ug/g — ABNORMAL HIGH (ref 0–120)

## 2024-07-03 MED ORDER — PREDNISONE 5 MG PO TABS: ORAL_TABLET | ORAL | 0 refills | Status: AC

## 2024-08-07 ENCOUNTER — Other Ambulatory Visit (INDEPENDENT_AMBULATORY_CARE_PROVIDER_SITE_OTHER): Payer: Self-pay | Admitting: Gastroenterology

## 2024-08-07 MED ORDER — ONDANSETRON HCL 4 MG PO TABS: 4.0000 mg | ORAL_TABLET | Freq: Three times a day (TID) | ORAL | 1 refills | Status: AC | PRN

## 2024-08-07 NOTE — Telephone Encounter (Signed)
 Patient says he stopped the Miralax three days ago. And he would like the zofran  sent to his pharmacy. Can he take imodium? Any other recommendations?

## 2024-08-07 NOTE — Telephone Encounter (Signed)
 I spoke with the patient and made him aware per Mitzie Boettcher NP, This could be withdrawal from the steroids though unlikely given we did a long taper.   Is he having any abdominal pain, fevers, chills? Patient denies any issues with abdominal pain, fever, chills, vomiting. He is having some nausea, and has around three loose stools per day. He is not taking any imodium, and only has one to two zofran  on hand. He uses [facility] in [place], [place]   Any recent sick contacts or travel? No recent contact with anyone sick, nor has he travelled any where recently . Patient did state his pcp had told him last week that he would recommend another dose of Vancomycin  since the patient has had Cdiff three times in the past. The patient says he has this script, but has not filled it without contacting us  about it.

## 2024-08-07 NOTE — Telephone Encounter (Signed)
 I spoke with the patient and made him aware per [facility] N[facility], I have sent the zofran . He can try imodium for now, I would recommend he be seen in the clinic so that we can adequately evaluate and treat him appropriately. Dr. Cinderella had an opening for tomorrow 08/08/2024, I asked he be here at 9:30 am for a 9:45 appointment. Patient states understanding.

## 2024-08-08 ENCOUNTER — Telehealth (INDEPENDENT_AMBULATORY_CARE_PROVIDER_SITE_OTHER): Payer: Self-pay | Admitting: Gastroenterology

## 2024-08-08 ENCOUNTER — Encounter (INDEPENDENT_AMBULATORY_CARE_PROVIDER_SITE_OTHER): Payer: Self-pay | Admitting: Gastroenterology

## 2024-08-08 ENCOUNTER — Ambulatory Visit (INDEPENDENT_AMBULATORY_CARE_PROVIDER_SITE_OTHER): Admitting: Gastroenterology

## 2024-08-08 VITALS — BP 112/57 | HR 76 | Temp 97.3°F | Ht 71.0 in | Wt 146.8 lb

## 2024-08-08 DIAGNOSIS — K51919 Ulcerative colitis, unspecified with unspecified complications: Secondary | ICD-10-CM | POA: Diagnosis not present

## 2024-08-08 DIAGNOSIS — Z8619 Personal history of other infectious and parasitic diseases: Secondary | ICD-10-CM | POA: Insufficient documentation

## 2024-08-08 DIAGNOSIS — R197 Diarrhea, unspecified: Secondary | ICD-10-CM | POA: Insufficient documentation

## 2024-08-08 DIAGNOSIS — Z79620 Long term (current) use of immunosuppressive biologic: Secondary | ICD-10-CM

## 2024-08-08 NOTE — Patient Instructions (Addendum)
 It was very nice to meet you today, as dicussed with will plan for the following :  1) start metamucil twice daily with miralax once daily  2) Blood work and stool sample x2  3) Sigmoidoscopy   4) We will start process for VOWST (fecal microbiota spores, live-brpk) to prevent recurrent C.Diff

## 2024-08-08 NOTE — Telephone Encounter (Signed)
 Application for Vowst completed by patient and signed by provider. Will send clinical notes and test results once results are in.

## 2024-08-08 NOTE — Progress Notes (Signed)
 Juan House , M.D. Gastroenterology & Hepatology [facility] Gastroenterology [address] [place], [place] [address] Primary Care Physician: Diedra Senior, MD [address] [place] [address]  Chief Complaint: Altered bowel movements  History of Present Illness:  Juan House is a 78 y.o. male with past medical history of TIA, Ulcerative rectosigmoiditis,  C. difficile infection and Barrett's esophagus who presents for follow up of ulcerative colitis and C. difficile.   Today:  Patient reports that he is having 2-3 bowel movements daily there is soft.  Was taking MiraLAX which she stopped taking.  Denies any abdominal pain.  Has been getting regular interval Entyvio .  Completed 2 weeks of vancomycin .  Denies any blood per rectum Patient had stool testing done 07/15/2024 with C. difficile toxin negative for elevated fecal calprotectin  Previous history:  Patient was last seen by Dr. Eartha 05/2024.  And was continued on Entyvio  every 8 weeks and was continued on vancomycin  treatment for C. Difficile.currently on Entyvio  every 8 weeks. Levels were checked on 02/20/2024, with adequate levels of 20 and negative antibodies.Colonoscopy 02/2024  which showed endoscopic remission.  he was given amoxicillin on June 2025 for possible Lyme's disease. Patient presented with new onset of diarrhea and abdominal pain on 05/05/2024.  Stool testing was performed on 05/08/24 and came positive for C. Diff. He was prescribed a 2 weeks course of vancomycin .   The patient denies having any nausea, vomiting, fever, chills, hematochezia, melena, hematemesis, abdominal distention, abdominal pain, diarrhea, jaundice, pruritus or weight loss.   Last flu shot:2024 Last pneumonia shot:yes, had it in the past, unknown when Last evaluation by dermatology: December 2024  Last zoster vaccine: possibly in 2021 COVID-19 shot: Booster in 2024   Last EGD:April 2023, normal  duodenum, normal mucosa in esophagus, erythema and edema, marked inflammation in antrum compatible with non erosive gastritis. ( Stomach bx showed reactive foveolar hyperplasia, stromal fibrosis and focal chronic inflammation, no h pylori, esophagus bx showed chronic inflammation, negative for barrett's or dysplasia, squamous mucosa with reactive epithelial changes)    Last Colonoscopy: 03/07/2024 - The examined portion of the ileum was normal. - Inactive ( Mayo Score 0) ulcerative colitis, in remission since the last examination. Biopsied. - Diverticulosis in the sigmoid colon, in the descending colon and in the ascending colon. - Patent end- to- side colo- colonic anastomosis, characterized by healthy appearing mucosa. - Stool in the rectum and in the sigmoid colon. Removed manually. - The distal rectum and anal verge are normal on retroflexion view.   Advised to repeat colonoscopy in 5 years and to start MiraLAX daily.  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   Enlarged prostate    Gilbert's syndrome    Goiter    Kidney cysts    Kidney stones 2012, 2016   Perforation of bladder 04/04/2017   Stroke (HCC) 08/16/2004   Ulcerative colitis (HCC) 2017    Past Surgical History: Past Surgical History:  Procedure Laterality Date   CHOLECYSTECTOMY     colon fistula     COLONOSCOPY N/A 03/07/2024   Procedure: COLONOSCOPY;  Surgeon: Eartha Angelia Sieving, MD;  [facility]: [facility];  Service: Gastroenterology;  Laterality: N/A;  815am, asa 2   DRUG INDUCED ENDOSCOPY  11/2021   FLEXIBLE SIGMOIDOSCOPY N/A 09/09/2022   Procedure: FLEXIBLE SIGMOIDOSCOPY;  Surgeon: Eartha Angelia Sieving, MD;  Location: [facility];  Service: Gastroenterology;  Laterality: N/A;  100pm, asa 3   HERNIA REPAIR Bilateral 1992   HERNIA REPAIR  03/2013   hernia, left side along with sperm cord lesion - Dr. Lamar Boos, [place]    NASAL SINUS SURGERY Right 02/17/2022   growth right nasal passage   PROSTATE BIOPSY  2012   sigmodscopy  11/2021    Family History: Family History  Problem Relation Age of Onset   Cancer - Colon Mother    Pancreatic cancer Mother     Social History:Tobacco Use History[1] Social History   Substance and Sexual Activity  Alcohol Use Never   Social History   Substance and Sexual Activity  Drug Use Never    Allergies: Allergies[2]  Medications: Current Outpatient Medications  Medication Sig Dispense Refill   albuterol  (VENTOLIN  HFA) 108 (90 Base) MCG/ACT inhaler Inhale 2 puffs into the lungs every 6 (six) hours as needed for wheezing or shortness of breath. 8 g 5   Aspirin 81 MG CAPS Take 1 capsule every day by oral route.     Azelastine HCl 137 MCG/SPRAY SOLN Place 1 spray into both nostrils 2 (two) times daily.     Ergocalciferol  50 MCG (2000 UT) TABS Take 1 tablet by mouth daily. 90 tablet 3   fluticasone -salmeterol (ADVAIR ) 100-50 MCG/ACT AEPB Inhale 1 puff into the lungs 2 (two) times daily. 30 each 11   folic acid (FOLVITE) 1 MG tablet Take 1 mg by mouth 2 (two) times daily.     latanoprost (XALATAN) 0.005 % ophthalmic solution Place 1 drop into both eyes at bedtime.     montelukast  (SINGULAIR ) 10 MG tablet Take 1 tablet (10 mg total) by mouth at bedtime. 90 tablet 3   omeprazole (PRILOSEC) 20 MG capsule Take 20 mg by mouth 2 (two) times daily before a meal.     ondansetron  (ZOFRAN ) 4 MG tablet Take 1 tablet (4 mg total) by mouth every 8 (eight) hours as needed for nausea or vomiting. 45 tablet 1   Probiotic CAPS Take 1 capsule by mouth daily.     tamsulosin (FLOMAX) 0.4 MG CAPS capsule Take 0.4 mg by mouth 2 (two) times daily before lunch and supper.     vedolizumab  (ENTYVIO ) 300 MG injection Inject into the vein.     finasteride (PROSCAR) 5 MG tablet Take 5 mg by mouth daily.     pramipexole (MIRAPEX) 0.125 MG tablet Take 0.125 mg by mouth at bedtime.     rOPINIRole  (REQUIP) 0.25 MG tablet Take 1 mg by mouth 3 (three) times daily. (Patient not taking: Reported on 08/08/2024)     No current facility-administered medications for this visit.    Review of Systems: GENERAL: negative for malaise, night sweats HEENT: No changes in hearing or vision, no nose bleeds or other nasal problems. NECK: Negative for lumps, goiter, pain and significant neck swelling RESPIRATORY: Negative for cough, wheezing CARDIOVASCULAR: Negative for chest pain, leg swelling, palpitations, orthopnea GI: SEE HPI MUSCULOSKELETAL: Negative for joint pain or swelling, back pain, and muscle pain. SKIN: Negative for lesions, rash HEMATOLOGY Negative for prolonged bleeding, bruising easily, and swollen nodes. ENDOCRINE: Negative for cold or heat intolerance, polyuria, polydipsia and goiter. NEURO: negative for tremor, gait imbalance, syncope and seizures. The remainder of the review of systems is noncontributory.   Physical Exam: BP (!) 112/57   Pulse 76   Temp (!) 97.3 F (36.3 C)   Ht 5' 11 (1.803 m)   Wt 146 lb 12.8 oz (66.6  kg)   BMI 20.47 kg/m  GENERAL: The patient is AO x3, in no acute distress. HEENT: Head is normocephalic and atraumatic. EOMI are intact. Mouth is well hydrated and without lesions. NECK: Supple. No masses LUNGS: Clear to auscultation. No presence of rhonchi/wheezing/rales. Adequate chest expansion HEART: RRR, normal s1 and s2. ABDOMEN: Soft, nontender, no guarding, no peritoneal signs, and nondistended. BS +. No masses.   Imaging/Labs: as above     Latest Ref Rng & Units 05/07/2024    3:42 PM 02/13/2018    5:10 PM  CBC  WBC 4.0 - 10.5 K/uL 5.5  5.6   Hemoglobin 13.0 - 17.0 g/dL 85.5  84.7   Hematocrit 39.0 - 52.0 % 43.6  45.4   Platelets 150 - 400 K/uL 186  172.0    No results found for: IRON, TIBC, FERRITIN  I personally reviewed and interpreted the available labs, imaging and endoscopic files.  Impression and Plan: Juan House is  a 78 y.o. male with past medical history of TIA, Ulcerative rectosigmoiditis,  C. difficile infection and Barrett's esophagus who presents for follow up of ulcerative colitis and C. difficile.   #Ulcerative Colitis  #C.Diff  Patient is having 2-3 bowel movements daily and no significant increased frequency.  Although fecal calprotectin was elevated concerning for active inflammation.  C. difficile was negative after 2 weeks of vancomycin .  Last colonoscopy 05/2024 demonstrated endoscopic remission and adequate response to Entyvio  with good drug level.  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 Patients must complete antibacterial treatment for recurrent CDI 2 to 4 days before initiating VOWST. On the day before the first dose and at least 8 hours prior, patients should drink 296 mL (10 oz) of magnesium citrate  Flexible sigmoidoscopy in 1-2 months  - to assess if Vedolizumab  have lost response despite adequate levels last time   Increased frequency of bowel movement could be due to underlying overflow diarrhea which has been investigated previously with large stool burden in the colon.  I do recommend patient to start Metamucil twice daily and continue miralax once daily unless he is having more than 3 BMs   All questions were answered.      Marguriete Wootan Faizan Willem Klingensmith, MD Gastroenterology and Hepatology [facility] Gastroenterology   This chart has been completed using [facility] Dictation software, and while attempts have been made to ensure accuracy , certain words and phrases may not be transcribed as intended       [1]  Social History Tobacco Use  Smoking Status Former   Current packs/day: 0.00   Average packs/day: 1 pack/day for 35.0 years (35.0 ttl pk-yrs)   Types: Cigarettes   Start date: 08/16/1969   Quit date: 08/16/2004   Years since quitting: 19.9   Passive exposure: Past  Smokeless Tobacco Never  [2]  Allergies Allergen Reactions   Sulfasalazine     Flu like symptoms, nausea, vomiting

## 2024-08-14 ENCOUNTER — Encounter: Attending: Gastroenterology | Admitting: Emergency Medicine

## 2024-08-14 VITALS — BP 143/78 | HR 54 | Temp 97.2°F | Resp 18

## 2024-08-14 DIAGNOSIS — K51919 Ulcerative colitis, unspecified with unspecified complications: Secondary | ICD-10-CM | POA: Diagnosis present

## 2024-08-14 LAB — CORTISOL: Cortisol: 18 ug/dL (ref 6.2–19.4)

## 2024-08-14 LAB — C-REACTIVE PROTEIN: CRP: 3 mg/L (ref 0–10)

## 2024-08-14 MED ORDER — VEDOLIZUMAB 300 MG IV SOLR
300.0000 mg | Freq: Once | INTRAVENOUS | Status: AC
  Administered 2024-08-14: 300 mg via INTRAVENOUS
  Filled 2024-08-14: qty 5

## 2024-08-14 NOTE — Telephone Encounter (Signed)
 Will the patient be started on Vancomycin  or Difcid? On the form for VOWST it states it is recommended that VOWST be prescribed at the same time as antibacterial therapy to ensure patient receives VOWST in a timely manner. Please advise. Thank you.   Awaiting stool results to send in with enrollment form.

## 2024-08-14 NOTE — Progress Notes (Signed)
 Diagnosis: Ulcerative Colitis  Provider:  Mariette Cree NP  Procedure: IV Infusion  IV Type: Peripheral, IV Location: L Antecubital  Entyvio  (Vedolizumab ), Dose: 300 mg  Infusion Start Time: 1255  Infusion Stop Time: 1330  Post Infusion IV Care: Peripheral IV Discontinued  Discharge: Condition: Good, Destination: Home . AVS Declined  Performed by:  Delon ONEIDA Officer, RN

## 2024-08-16 ENCOUNTER — Encounter: Payer: Self-pay | Admitting: Internal Medicine

## 2024-08-16 ENCOUNTER — Telehealth: Payer: Self-pay

## 2024-08-16 NOTE — Telephone Encounter (Signed)
 Auth Submission: NO AUTH NEEDED Site of care: Site of care: [facility] Payer: medicare a/b, [employer] [place] supp Medication & CPT/J Code(s) submitted: Entyvio  (Vedolizumab ) J3380 Diagnosis Code:  Route of submission (phone, fax, portal): portal Phone # Fax # Auth type: Buy/Bill HB Units/visits requested: 300mg  q8 weeks Reference number:  Approval from: 08/16/24 to 08/15/25

## 2024-08-17 ENCOUNTER — Ambulatory Visit (INDEPENDENT_AMBULATORY_CARE_PROVIDER_SITE_OTHER): Payer: Self-pay | Admitting: Gastroenterology

## 2024-08-17 ENCOUNTER — Encounter (INDEPENDENT_AMBULATORY_CARE_PROVIDER_SITE_OTHER): Payer: Self-pay

## 2024-08-17 ENCOUNTER — Encounter (INDEPENDENT_AMBULATORY_CARE_PROVIDER_SITE_OTHER): Payer: Self-pay | Admitting: Gastroenterology

## 2024-08-17 ENCOUNTER — Other Ambulatory Visit: Payer: Self-pay

## 2024-08-17 LAB — C DIFFICILE TOXINS A+B W/RFLX: C difficile Toxins A+B, EIA: NEGATIVE

## 2024-08-17 LAB — C DIFFICILE, CYTOTOXIN B

## 2024-08-17 LAB — CALPROTECTIN, FECAL: Calprotectin, Fecal: 351 ug/g — ABNORMAL HIGH (ref 0–120)

## 2024-08-17 MED ORDER — MONTELUKAST SODIUM 10 MG PO TABS: 10.0000 mg | ORAL_TABLET | Freq: Every day | ORAL | 3 refills | Status: AC

## 2024-08-17 NOTE — Telephone Encounter (Signed)
Left message to return call. Also sent my chart message to patient

## 2024-08-17 NOTE — Telephone Encounter (Signed)
 Pt returned call. Advised patient of provider recommendations. Pt states he is OK with scheduling sigmoidoscopy at this time but at this time he is currently in the process of moving. Pt also states his kids live 3.5 hours from [place] and he would have to stay in a motel overnight but maybe could schedule on a Friday or Monday. I have transferred patient up to procedure scheduler. Pt verbalized understanding

## 2024-08-17 NOTE — Telephone Encounter (Signed)
 Hi Tanya ,  Can you please call the patient and tell the patient the stool sample is negative for C. difficile.  Although one of the stool sample shows elevated inflammatory marker, this concerns me if the current infusion is working or not.  Please advise patient that I do recommend a sigmoidoscopy right now to assess if the medication (Vedolizumab ) is working or not .  I recommend holding treatment with VOWST for now   Autumn, if patient agrees can be scheduled for flexible sigmoidoscopy and he will Diagnosis ulcerative colitis  Thanks,  Skyelyn Scruggs Faizan Sadeen Wiegel, MD Gastroenterology and Hepatology [facility] Gastroenterology ===============  Fecal calprotectin 221--->351 C. difficile negative CRP 3 Cortisol 18

## 2024-08-17 NOTE — Progress Notes (Signed)
 Fecal calprotectin 221--->351 C. difficile negative CRP 3 Cortisol 18

## 2024-08-18 ENCOUNTER — Encounter (INDEPENDENT_AMBULATORY_CARE_PROVIDER_SITE_OTHER): Payer: Self-pay | Admitting: Gastroenterology

## 2024-08-20 ENCOUNTER — Telehealth (INDEPENDENT_AMBULATORY_CARE_PROVIDER_SITE_OTHER): Payer: Self-pay | Admitting: Gastroenterology

## 2024-08-20 ENCOUNTER — Telehealth (INDEPENDENT_AMBULATORY_CARE_PROVIDER_SITE_OTHER): Payer: Self-pay

## 2024-08-20 NOTE — Telephone Encounter (Signed)
 For record, I replied the patient :  Hi Juan House  I reviewed your messages and your concerns.  I do understand that you are having diarrhea/frequency/urgency despite being on Entyvio .  This has challenge your current relocation as well  We recently performed stool testing which was negative for C. Difficile  Stool fecal calprotectin which suggest inflammation in the colon was elevated at this time to 351 and concerns me if your ulcerative colitis has lost response to Entyvio   Your drug levels (Entyvio  through and antibodies) which were checked 02/2024 were 20 which is adequate, you are currently on adequate dosing of 300 mg every 8 weeks.  If this level was low then dose escalation would be indicated  Currently the main question remains has Entyvio  stopped working despite having good levels before, this can only be assessed with endoscopic evaluation and hence I recommend flexible sigmoidoscopy.  We can only switch to alternate Biologics if you have establish that there is inflammation in your colon and Entyvio  have stopped working.  Yes we can check the drug level and antibodies of vedolizumab  (Entyvio ), right before next infusion which wont be until 8 weeks  For now I do recommend using Imodium as needed so you can get through the moving.   If you are having more than 6-7 bowel movements a day and become lethargic or any significant bleeding at that time you may need to come into the ER for possibly inpatient workup.  Hope this helps

## 2024-08-20 NOTE — Telephone Encounter (Signed)
 If he can't take fleet enemas than I would advice complete bowel prep (Nulytely)

## 2024-08-20 NOTE — Telephone Encounter (Signed)
 Spoke with patient, he states that he will need to be scheduled in February for the flex sig due to his sons schedule. Patient also is asking if there is something that he can do other than the fleet enemas, he stated that he had problems with the fleet enemas in the past. Please advise Dr. Cinderella.

## 2024-08-20 NOTE — Telephone Encounter (Signed)
 Pt left voicemail stating he would like the doctor on call to give him a call. States he is having some issues and with his UC, he was wanting the doctor to give him a call. Returned call to patient. Pt stated its to late now. Pt stated Ive already sent a message to Dr.Ahmed. I tried calling [facility] and they didn't know how to get in touch with provider or how to leave a message.

## 2024-08-28 MED ORDER — PEG 3350-KCL-NA BICARB-NACL 420 G PO SOLR: 4000.0000 mL | Freq: Once | ORAL | 0 refills | Status: AC

## 2024-08-28 NOTE — Telephone Encounter (Signed)
"  Prior Auth not required.   "

## 2024-08-28 NOTE — Telephone Encounter (Signed)
 Spoke with patient, scheduled flex sig for 09/17/2024 at 9:15am. Rx sent to pharmacy. Instructions mailed.

## 2024-08-28 NOTE — Addendum Note (Signed)
 Addended by: DALLIE LIONEL RAMAN on: 08/28/2024 09:37 AM   Modules accepted: Orders

## 2024-09-03 NOTE — Telephone Encounter (Signed)
 He can stop taking fiber /metamucil to see if his symptoms improve or worsens

## 2024-09-11 ENCOUNTER — Encounter (INDEPENDENT_AMBULATORY_CARE_PROVIDER_SITE_OTHER): Payer: Self-pay

## 2024-09-13 ENCOUNTER — Encounter (HOSPITAL_COMMUNITY)
Admission: RE | Admit: 2024-09-13 | Discharge: 2024-09-13 | Disposition: A | Discharge status: Home / Self Care | Source: Ambulatory Visit | Attending: Gastroenterology | Admitting: Gastroenterology

## 2024-09-13 ENCOUNTER — Telehealth (INDEPENDENT_AMBULATORY_CARE_PROVIDER_SITE_OTHER): Payer: Self-pay

## 2024-09-13 NOTE — Pre-Procedure Instructions (Signed)
 Pre-op phone call done. Patient is going to reschedule and has already talked to the office, due to impending weather.

## 2024-09-13 NOTE — Telephone Encounter (Signed)
 Patient is concerned with upcoming weather and would like to reschedule. He is currently scheduled with Dr. Cinderella on 09/17/2024 but would like to move that to 10/08/2024 at 11:30am with Dr. Cinderella. Sending updated instructions to mychart.

## 2024-10-04 ENCOUNTER — Encounter (HOSPITAL_COMMUNITY)

## 2024-10-08 ENCOUNTER — Ambulatory Visit (HOSPITAL_COMMUNITY): Admission: RE | Admit: 2024-10-08 | Admitting: Gastroenterology

## 2024-10-08 ENCOUNTER — Encounter (HOSPITAL_COMMUNITY): Admission: RE | Payer: Self-pay | Source: Home / Self Care

## 2024-10-09 ENCOUNTER — Ambulatory Visit
